# Patient Record
Sex: Female | Born: 1960 | Race: White | Hispanic: No | Marital: Married | Smoking: Never smoker
Health system: Southern US, Community
[De-identification: ages and names within clinical notes are randomized; demographics above are authoritative.]

## PROBLEM LIST (undated history)

## (undated) DIAGNOSIS — K635 Polyp of colon: Secondary | ICD-10-CM

## (undated) DIAGNOSIS — E785 Hyperlipidemia, unspecified: Secondary | ICD-10-CM

## (undated) DIAGNOSIS — E039 Hypothyroidism, unspecified: Secondary | ICD-10-CM

## (undated) DIAGNOSIS — R011 Cardiac murmur, unspecified: Secondary | ICD-10-CM

## (undated) DIAGNOSIS — Z87898 Personal history of other specified conditions: Secondary | ICD-10-CM

## (undated) DIAGNOSIS — Z87440 Personal history of urinary (tract) infections: Secondary | ICD-10-CM

## (undated) DIAGNOSIS — Z872 Personal history of diseases of the skin and subcutaneous tissue: Secondary | ICD-10-CM

## (undated) HISTORY — DX: Personal history of urinary (tract) infections: Z87.440

## (undated) HISTORY — DX: Polyp of colon: K63.5

## (undated) HISTORY — PX: TONSILLECTOMY: SUR1361

## (undated) HISTORY — DX: Personal history of diseases of the skin and subcutaneous tissue: Z87.2

## (undated) HISTORY — DX: Personal history of other specified conditions: Z87.898

## (undated) HISTORY — DX: Hyperlipidemia, unspecified: E78.5

---

## 1994-07-18 HISTORY — PX: NASAL SEPTUM SURGERY: SHX37

## 2004-06-17 ENCOUNTER — Ambulatory Visit: Payer: Self-pay | Admitting: Internal Medicine

## 2004-06-23 ENCOUNTER — Ambulatory Visit: Payer: Self-pay | Admitting: Internal Medicine

## 2004-12-30 ENCOUNTER — Ambulatory Visit: Payer: Self-pay | Admitting: Internal Medicine

## 2005-03-14 ENCOUNTER — Ambulatory Visit: Payer: Self-pay | Admitting: Unknown Physician Specialty

## 2006-01-03 ENCOUNTER — Ambulatory Visit: Payer: Self-pay | Admitting: Obstetrics and Gynecology

## 2007-01-18 ENCOUNTER — Ambulatory Visit: Payer: Self-pay | Admitting: Obstetrics and Gynecology

## 2007-03-22 ENCOUNTER — Ambulatory Visit: Payer: Self-pay | Admitting: Gastroenterology

## 2008-01-22 ENCOUNTER — Ambulatory Visit: Payer: Self-pay | Admitting: Obstetrics and Gynecology

## 2011-05-05 ENCOUNTER — Ambulatory Visit: Payer: Self-pay | Admitting: Obstetrics and Gynecology

## 2012-06-12 ENCOUNTER — Ambulatory Visit: Payer: Self-pay | Admitting: Obstetrics and Gynecology

## 2013-06-27 ENCOUNTER — Ambulatory Visit: Payer: Self-pay | Admitting: Obstetrics and Gynecology

## 2014-10-24 ENCOUNTER — Ambulatory Visit
Admit: 2014-10-24 | Disposition: A | Discharge status: Home / Self Care | Payer: Self-pay | Attending: Obstetrics and Gynecology | Admitting: Obstetrics and Gynecology

## 2015-09-21 ENCOUNTER — Other Ambulatory Visit: Payer: Self-pay | Admitting: Obstetrics and Gynecology

## 2015-09-21 DIAGNOSIS — Z1231 Encounter for screening mammogram for malignant neoplasm of breast: Secondary | ICD-10-CM

## 2016-10-03 ENCOUNTER — Other Ambulatory Visit: Payer: Self-pay | Admitting: Obstetrics and Gynecology

## 2016-10-03 DIAGNOSIS — Z1231 Encounter for screening mammogram for malignant neoplasm of breast: Secondary | ICD-10-CM

## 2016-10-04 ENCOUNTER — Telehealth: Payer: Self-pay

## 2016-10-04 ENCOUNTER — Other Ambulatory Visit: Payer: Self-pay

## 2016-10-04 DIAGNOSIS — Z86010 Personal history of colonic polyps: Secondary | ICD-10-CM

## 2016-10-04 NOTE — Telephone Encounter (Signed)
Gastroenterology Pre-Procedure Review  Request Date: 12/23/16 Requesting Physician: Dr. Vicente Males  PATIENT REVIEW QUESTIONS: The patient responded to the following health history questions as indicated:    1. Are you having any GI issues? no 2. Do you have a personal history of Polyps? no 3. Do you have a family history of Colon Cancer or Polyps? no 4. Diabetes Mellitus? no 5. Joint replacements in the past 12 months?no 6. Major health problems in the past 3 months?no 7. Any artificial heart valves, MVP, or defibrillator?no    MEDICATIONS & ALLERGIES:    Patient reports the following regarding taking any anticoagulation/antiplatelet therapy:   Plavix, Coumadin, Eliquis, Xarelto, Lovenox, Pradaxa, Brilinta, or Effient? no Aspirin? no  Patient confirms/reports the following medications:  No current outpatient prescriptions on file.   No current facility-administered medications for this visit.     Patient confirms/reports the following allergies:  Allergies not on file  No orders of the defined types were placed in this encounter.   AUTHORIZATION INFORMATION Primary Insurance: 1D#: Group #:  Secondary Insurance: 1D#: Group #:  SCHEDULE INFORMATION: Date: 12/23/16 Time: Location: [place]

## 2016-12-23 ENCOUNTER — Encounter
Admission: RE | Disposition: A | Discharge status: Home / Self Care | Payer: Self-pay | Source: Ambulatory Visit | Attending: Gastroenterology

## 2016-12-23 ENCOUNTER — Encounter: Payer: Self-pay | Admitting: *Deleted

## 2016-12-23 ENCOUNTER — Ambulatory Visit: Payer: 59 | Admitting: Anesthesiology

## 2016-12-23 ENCOUNTER — Ambulatory Visit
Admission: RE | Admit: 2016-12-23 | Discharge: 2016-12-23 | Disposition: A | Discharge status: Home / Self Care | Payer: 59 | Source: Ambulatory Visit | Attending: Gastroenterology | Admitting: Gastroenterology

## 2016-12-23 DIAGNOSIS — K640 First degree hemorrhoids: Secondary | ICD-10-CM | POA: Diagnosis not present

## 2016-12-23 DIAGNOSIS — Z86010 Personal history of colonic polyps: Secondary | ICD-10-CM

## 2016-12-23 DIAGNOSIS — E039 Hypothyroidism, unspecified: Secondary | ICD-10-CM | POA: Insufficient documentation

## 2016-12-23 DIAGNOSIS — D122 Benign neoplasm of ascending colon: Secondary | ICD-10-CM | POA: Diagnosis not present

## 2016-12-23 DIAGNOSIS — R011 Cardiac murmur, unspecified: Secondary | ICD-10-CM | POA: Insufficient documentation

## 2016-12-23 DIAGNOSIS — K5730 Diverticulosis of large intestine without perforation or abscess without bleeding: Secondary | ICD-10-CM | POA: Diagnosis not present

## 2016-12-23 DIAGNOSIS — Z1211 Encounter for screening for malignant neoplasm of colon: Secondary | ICD-10-CM | POA: Diagnosis not present

## 2016-12-23 DIAGNOSIS — D125 Benign neoplasm of sigmoid colon: Secondary | ICD-10-CM | POA: Diagnosis not present

## 2016-12-23 HISTORY — DX: Cardiac murmur, unspecified: R01.1

## 2016-12-23 HISTORY — PX: COLONOSCOPY WITH PROPOFOL: SHX5780

## 2016-12-23 HISTORY — DX: Hypothyroidism, unspecified: E03.9

## 2016-12-23 SURGERY — COLONOSCOPY WITH PROPOFOL
Anesthesia: General

## 2016-12-23 MED ORDER — ONDANSETRON HCL 4 MG/2ML IJ SOLN
INTRAMUSCULAR | Status: DC | PRN
Start: 2016-12-23 — End: 2016-12-23
  Administered 2016-12-23: 4 mg via INTRAVENOUS

## 2016-12-23 MED ORDER — LIDOCAINE HCL (PF) 2 % IJ SOLN
INTRAMUSCULAR | Status: AC
  Filled 2016-12-23: qty 2

## 2016-12-23 MED ORDER — MIDAZOLAM HCL 2 MG/2ML IJ SOLN
INTRAMUSCULAR | Status: DC | PRN
  Administered 2016-12-23: 2 mg via INTRAVENOUS

## 2016-12-23 MED ORDER — PROPOFOL 500 MG/50ML IV EMUL
INTRAVENOUS | Status: AC
  Filled 2016-12-23: qty 50

## 2016-12-23 MED ORDER — PROPOFOL 500 MG/50ML IV EMUL
INTRAVENOUS | Status: DC | PRN
  Administered 2016-12-23: 150 ug/kg/min via INTRAVENOUS

## 2016-12-23 MED ORDER — ONDANSETRON HCL 4 MG/2ML IJ SOLN
INTRAMUSCULAR | Status: AC
Start: 2016-12-23 — End: 2016-12-23
  Filled 2016-12-23: qty 2

## 2016-12-23 MED ORDER — MIDAZOLAM HCL 2 MG/2ML IJ SOLN
INTRAMUSCULAR | Status: AC
  Filled 2016-12-23: qty 2

## 2016-12-23 MED ORDER — SODIUM CHLORIDE 0.9 % IV SOLN
INTRAVENOUS | Status: DC
  Administered 2016-12-23 (×2): via INTRAVENOUS

## 2016-12-23 MED ORDER — LIDOCAINE HCL (CARDIAC) 20 MG/ML IV SOLN
INTRAVENOUS | Status: DC | PRN
  Administered 2016-12-23: 60 mg via INTRAVENOUS

## 2016-12-23 MED ORDER — PROPOFOL 10 MG/ML IV BOLUS
INTRAVENOUS | Status: DC | PRN
  Administered 2016-12-23: 50 mg via INTRAVENOUS

## 2016-12-23 NOTE — Anesthesia Post-op Follow-up Note (Cosign Needed)
Anesthesia QCDR form completed.        

## 2016-12-23 NOTE — H&P (Signed)
  Jonathon Bellows MD [address] [place], [place] [address] Phone: [phone] Fax : [phone]  Primary Care Physician:  Elgie Collard, MD Primary Gastroenterologist:  Dr. Jonathon Bellows   Pre-Procedure History & Physical: HPI:  Linda Leonard is a 56 y.o. female is here for an colonoscopy.   Past Medical History:  Diagnosis Date  . Heart murmur   . Hypothyroidism     Past Surgical History:  Procedure Laterality Date  . NASAL SEPTUM SURGERY  1996  . TONSILLECTOMY     1999    Prior to Admission medications   Medication Sig Start Date End Date Taking? Authorizing Provider  levothyroxine (SYNTHROID, LEVOTHROID) 75 MCG tablet Take 75 mcg by mouth daily before breakfast.   Yes [provider]  Vitamin D, Ergocalciferol, (DRISDOL) 50000 units CAPS capsule Take 50,000 Units by mouth every 30 (thirty) days.   Yes [provider]    Allergies as of 10/05/54  . (Not on File)    History reviewed. No pertinent family history.  Social History   Social History  . Marital status: Married    Spouse name: N/A  . Number of children: N/A  . Years of education: N/A   Occupational History  . Not on file.   Social History Main Topics  . Smoking status: Never Smoker  . Smokeless tobacco: Never Used  . Alcohol use No  . Drug use: No  . Sexual activity: Not on file   Other Topics Concern  . Not on file   Social History Narrative  . No narrative on file    Review of Systems: See HPI, otherwise negative ROS  Physical Exam: BP (!) 127/55   Pulse 97   Temp 97.8 F (36.6 C) (Tympanic)   Resp 14   Ht 5\' 3"  (1.6 m)   Wt 121 lb (54.9 kg)   SpO2 100%   BMI 21.43 kg/m  General:   Alert,  pleasant and cooperative in NAD Head:  Normocephalic and atraumatic. Neck:  Supple; no masses or thyromegaly. Lungs:  Clear throughout to auscultation.    Heart:  Regular rate and rhythm. Abdomen:  Soft, nontender and nondistended. Normal bowel sounds,  without guarding, and without rebound.   Neurologic:  Alert and  oriented x4;  grossly normal neurologically.  Impression/Plan: [name] is here for an colonoscopy to be performed for Screening colonoscopy average risk    Risks, benefits, limitations, and alternatives regarding  colonoscopy have been reviewed with the patient.  Questions have been answered.  All parties agreeable.   Jonathon Bellows, MD  12/24/54, 8:33 AM

## 2016-12-23 NOTE — Op Note (Signed)
[facility] Gastroenterology Patient Name: Linda Leonard Procedure Date: 12/23/2016 8:35 AM MRN: [id number] Account #: [id number] Date of Birth: January 18, 1961 Admit Type: Outpatient Age: 56 Room: [facility] ENDO ROOM 4 Gender: Female Note Status: Finalized Procedure:            Colonoscopy Indications:          Screening for colorectal malignant neoplasm Providers:            Jonathon Bellows MD, MD Referring MD:         Shelby Mattocks. Georga Bora, MD (Referring MD) Medicines:            Monitored Anesthesia Care Complications:        No immediate complications. Procedure:            Pre-Anesthesia Assessment:                       - Prior to the procedure, a History and Physical was                        performed, and patient medications, allergies and                        sensitivities were reviewed. The patient's tolerance of                        previous anesthesia was reviewed.                       - The risks and benefits of the procedure and the                        sedation options and risks were discussed with the                        patient. All questions were answered and informed                        consent was obtained.                       - ASA Grade Assessment: I - A normal, healthy patient.                       After obtaining informed consent, the colonoscope was                        passed under direct vision. Throughout the procedure,                        the patient's blood pressure, pulse, and oxygen                        saturations were monitored continuously. The                        Colonoscope was introduced through the anus and                        advanced to the the cecum, identified by the  appendiceal orifice, IC valve and transillumination.                        The colonoscopy was performed with ease. The patient                        tolerated the procedure well. The quality of the bowel           preparation was excellent. Findings:      The perianal and digital rectal examinations were normal.      A few small-mouthed diverticula were found in the sigmoid colon.      Non-bleeding internal hemorrhoids were found during retroflexion. The       hemorrhoids were medium-sized and Grade I (internal hemorrhoids that do       not prolapse).      Two sessile polyps were found in the sigmoid colon and ascending colon.       The polyps were 3 to 5 mm in size.      The exam was otherwise without abnormality on direct and retroflexion       views. Impression:           - Diverticulosis in the sigmoid colon.                       - Non-bleeding internal hemorrhoids.                       - Two 3 to 5 mm polyps in the sigmoid colon and in the                        ascending colon.                       - The examination was otherwise normal on direct and                        retroflexion views.                       - No specimens collected. Recommendation:       - Discharge patient to home (with escort).                       - Resume previous diet.                       - Continue present medications.                       - Await pathology results.                       - Repeat colonoscopy in 5-10 years for surveillance                        based on pathology results. Procedure Code(s):    --- Professional ---                       W4315, Colorectal cancer screening; colonoscopy on                        individual not meeting criteria  for high risk Diagnosis Code(s):    --- Professional ---                       Z12.11, Encounter for screening for malignant neoplasm                        of colon                       K64.0, First degree hemorrhoids                       D12.5, Benign neoplasm of sigmoid colon                       D12.2, Benign neoplasm of ascending colon                       K57.30, Diverticulosis of large intestine without                         perforation or abscess without bleeding CPT copyright 2016 American Medical Association. All rights reserved. The codes documented in this report are preliminary and upon coder review may  be revised to meet current compliance requirements. Jonathon Bellows, MD Jonathon Bellows MD, MD 12/23/2016 9:10:34 AM This report has been signed electronically. Number of Addenda: 0 Note Initiated On: 12/23/2016 8:35 AM Scope Withdrawal Time: 0 hours 18 minutes 4 seconds  Total Procedure Duration: 0 hours 24 minutes 20 seconds       [facility]

## 2016-12-23 NOTE — Anesthesia Preprocedure Evaluation (Signed)
Anesthesia Evaluation  Patient identified by MRN, date of birth, ID band Patient awake    Reviewed: Allergy & Precautions, NPO status , Patient's Chart, lab work & pertinent test results  Airway Mallampati: II       Dental  (+) Teeth Intact   Pulmonary neg pulmonary ROS,    Pulmonary exam normal        Cardiovascular Exercise Tolerance: Good  Rhythm:Regular     Neuro/Psych negative neurological ROS     GI/Hepatic negative GI ROS, Neg liver ROS,   Endo/Other  Hypothyroidism   Renal/GU negative Renal ROS     Musculoskeletal   Abdominal Normal abdominal exam  (+)   Peds negative pediatric ROS (+)  Hematology negative hematology ROS (+)   Anesthesia Other Findings   Reproductive/Obstetrics                             Anesthesia Physical Anesthesia Plan  ASA: I  Anesthesia Plan: General   Post-op Pain Management:    Induction: Intravenous  PONV Risk Score and Plan: 1 and Ondansetron and Treatment may vary due to age  Airway Management Planned: Natural Airway  Additional Equipment:   Intra-op Plan:   Post-operative Plan:   Informed Consent: I have reviewed the patients History and Physical, chart, labs and discussed the procedure including the risks, benefits and alternatives for the proposed anesthesia with the patient or authorized representative who has indicated his/her understanding and acceptance.     Plan Discussed with: CRNA  Anesthesia Plan Comments:         Anesthesia Quick Evaluation

## 2016-12-23 NOTE — Anesthesia Postprocedure Evaluation (Signed)
Anesthesia Post Note  Patient: Linda Leonard  Procedure(s) Performed: Procedure(s) (LRB): COLONOSCOPY WITH PROPOFOL (N/A)  Patient location during evaluation: PACU Anesthesia Type: General Level of consciousness: awake Pain management: pain level controlled Vital Signs Assessment: post-procedure vital signs reviewed and stable Respiratory status: nonlabored ventilation Cardiovascular status: stable Anesthetic complications: no     Last Vitals:  Vitals:   12/23/16 0941 12/23/16 0942  BP: 107/66 102/68  Pulse: 62 63  Resp: 14 14  Temp:      Last Pain:  Vitals:   12/23/16 0912  TempSrc: Tympanic                 VAN STAVEREN,[place]

## 2016-12-23 NOTE — Transfer of Care (Signed)
Immediate Anesthesia Transfer of Care Note  Patient: Linda Leonard  Procedure(s) Performed: Procedure(s): COLONOSCOPY WITH PROPOFOL (N/A)  Patient Location: PACU and Endoscopy Unit  Anesthesia Type:General  Level of Consciousness: sedated and responds to stimulation  Airway & Oxygen Therapy: Patient Spontanous Breathing and Patient connected to nasal cannula oxygen  Post-op Assessment: Report given to RN and Post -op Vital signs reviewed and stable  Post vital signs: Reviewed and stable  Last Vitals:  Vitals:   12/23/16 0807 12/23/16 0912  BP: (!) 127/55 90/61  Pulse: 97 68  Resp: 14 17  Temp: 36.6 C (!) 35.7 C    Last Pain:  Vitals:   12/23/16 0912  TempSrc: Tympanic         Complications: No apparent anesthesia complications

## 2016-12-26 ENCOUNTER — Encounter: Payer: Self-pay | Admitting: Gastroenterology

## 2016-12-26 LAB — SURGICAL PATHOLOGY

## 2016-12-28 ENCOUNTER — Encounter: Payer: Self-pay | Admitting: Gastroenterology

## 2016-12-30 NOTE — Progress Notes (Signed)
Addendum to procedure note   Two 3 to 5 mm polyps in the sigmoid colon and in the ascending colon.They were completely excised using a cold biopsy forceps and sent in a jar to pathology  Dr Jonathon Bellows MD,MRCP [facility]) Gastroenterology/Hepatology Pager: [phone]

## 2017-02-23 ENCOUNTER — Ambulatory Visit
Admission: RE | Admit: 2017-02-23 | Discharge: 2017-02-23 | Disposition: A | Discharge status: Home / Self Care | Payer: 59 | Source: Ambulatory Visit | Attending: Obstetrics and Gynecology | Admitting: Obstetrics and Gynecology

## 2017-02-23 DIAGNOSIS — Z1231 Encounter for screening mammogram for malignant neoplasm of breast: Secondary | ICD-10-CM | POA: Insufficient documentation

## 2017-10-03 ENCOUNTER — Other Ambulatory Visit: Payer: Self-pay | Admitting: Obstetrics and Gynecology

## 2017-10-03 DIAGNOSIS — Z1231 Encounter for screening mammogram for malignant neoplasm of breast: Secondary | ICD-10-CM

## 2018-03-29 ENCOUNTER — Ambulatory Visit
Admission: RE | Admit: 2018-03-29 | Discharge: 2018-03-29 | Disposition: A | Discharge status: Home / Self Care | Payer: 59 | Source: Ambulatory Visit | Attending: Obstetrics and Gynecology | Admitting: Obstetrics and Gynecology

## 2018-03-29 DIAGNOSIS — Z1231 Encounter for screening mammogram for malignant neoplasm of breast: Secondary | ICD-10-CM

## 2018-11-07 ENCOUNTER — Other Ambulatory Visit: Payer: Self-pay | Admitting: Obstetrics and Gynecology

## 2018-11-07 DIAGNOSIS — Z1231 Encounter for screening mammogram for malignant neoplasm of breast: Secondary | ICD-10-CM

## 2019-02-05 LAB — LIPID PANEL
Cholesterol: 250 — AB (ref 0–200)
HDL: 109 — AB (ref 35–70)
LDL Cholesterol: 131
Triglycerides: 50 (ref 40–160)

## 2019-02-14 ENCOUNTER — Other Ambulatory Visit: Payer: Self-pay

## 2019-02-14 ENCOUNTER — Emergency Department
Admission: EM | Admit: 2019-02-14 | Discharge: 2019-02-14 | Disposition: A | Discharge status: Home / Self Care | Payer: 59 | Attending: Emergency Medicine | Admitting: Emergency Medicine

## 2019-02-14 ENCOUNTER — Emergency Department: Payer: 59

## 2019-02-14 ENCOUNTER — Encounter: Payer: Self-pay | Admitting: Emergency Medicine

## 2019-02-14 DIAGNOSIS — R002 Palpitations: Secondary | ICD-10-CM | POA: Diagnosis present

## 2019-02-14 DIAGNOSIS — H9313 Tinnitus, bilateral: Secondary | ICD-10-CM | POA: Diagnosis not present

## 2019-02-14 DIAGNOSIS — E0590 Thyrotoxicosis, unspecified without thyrotoxic crisis or storm: Secondary | ICD-10-CM | POA: Diagnosis not present

## 2019-02-14 LAB — CBC
HCT: 38.9 % (ref 36.0–46.0)
Hemoglobin: 12.8 g/dL (ref 12.0–15.0)
MCH: 26.1 pg (ref 26.0–34.0)
MCHC: 32.9 g/dL (ref 30.0–36.0)
MCV: 79.2 fL — ABNORMAL LOW (ref 80.0–100.0)
Platelets: 255 10*3/uL (ref 150–400)
RBC: 4.91 MIL/uL (ref 3.87–5.11)
RDW: 13.2 % (ref 11.5–15.5)
WBC: 4.8 10*3/uL (ref 4.0–10.5)
nRBC: 0 % (ref 0.0–0.2)

## 2019-02-14 LAB — COMPREHENSIVE METABOLIC PANEL
ALT: 15 U/L (ref 0–44)
AST: 18 U/L (ref 15–41)
Albumin: 4.3 g/dL (ref 3.5–5.0)
Alkaline Phosphatase: 50 U/L (ref 38–126)
Anion gap: 8 (ref 5–15)
BUN: 8 mg/dL (ref 6–20)
CO2: 24 mmol/L (ref 22–32)
Calcium: 9.1 mg/dL (ref 8.9–10.3)
Chloride: 107 mmol/L (ref 98–111)
Creatinine, Ser: 0.63 mg/dL (ref 0.44–1.00)
GFR calc Af Amer: >60 mL/min (ref >60)
GFR calc non Af Amer: >60 mL/min (ref >60)
Glucose, Bld: 129 mg/dL — ABNORMAL HIGH (ref 70–99)
Potassium: 3.3 mmol/L — ABNORMAL LOW (ref 3.5–5.1)
Sodium: 139 mmol/L (ref 135–145)
Total Bilirubin: 0.7 mg/dL (ref 0.3–1.2)
Total Protein: 7.1 g/dL (ref 6.5–8.1)

## 2019-02-14 LAB — TSH: TSH: 1.216 u[IU]/mL (ref 0.350–4.500)

## 2019-02-14 LAB — TROPONIN I (HIGH SENSITIVITY): Troponin I (High Sensitivity): 4 ng/L (ref <18)

## 2019-02-14 LAB — T4, FREE: Free T4: 1.28 ng/dL — ABNORMAL HIGH (ref 0.61–1.12)

## 2019-02-14 MED ORDER — LORAZEPAM 1 MG PO TABS
1.0000 mg | ORAL_TABLET | Freq: Once | ORAL | Status: AC
  Administered 2019-02-14: 14:00:00 1 mg via ORAL
  Filled 2019-02-14: qty 1

## 2019-02-14 MED ORDER — LEVOTHYROXINE SODIUM 50 MCG PO TABS: 50.0000 ug | ORAL_TABLET | Freq: Every day | ORAL | 11 refills | Status: DC

## 2019-02-14 MED ORDER — PROPRANOLOL HCL 40 MG PO TABS: 40.0000 mg | ORAL_TABLET | Freq: Two times a day (BID) | ORAL | 0 refills | Status: AC | PRN

## 2019-02-14 NOTE — ED Notes (Signed)
AAOx3.  Skin warm and dry.  NAD 

## 2019-02-14 NOTE — ED Triage Notes (Signed)
Pt reports last Friday started with shaking a lot at night, a decrease in appetite, ringing in her ears and the feeling that her heart is beating irregularity.

## 2019-02-14 NOTE — ED Provider Notes (Signed)
[facility] Emergency Department Provider Note       Time seen: ----------------------------------------- 1:41 PM on 02/14/2019 -----------------------------------------   I have reviewed the triage vital signs and the nursing notes.  HISTORY   Chief Complaint Shaking, Irregular Heart Beat, and Tinnitus    HPI Linda Leonard is a 58 y.o. female with a history of heart murmur and hypothyroidism who presents to the ED for shaking as well as decreased appetite, ringing in her ears and feeling like her heart is beating irregularly.  Symptoms started last Friday.  She does not describe any pain.  Past Medical History:  Diagnosis Date  . Heart murmur   . Hypothyroidism     There are no active problems to display for this patient.   Past Surgical History:  Procedure Laterality Date  . COLONOSCOPY WITH PROPOFOL N/A 12/23/2016   Procedure: COLONOSCOPY WITH PROPOFOL;  Surgeon: Jonathon Bellows, MD;  Location: [facility] ENDOSCOPY;  Service: Endoscopy;  Laterality: N/A;  . NASAL SEPTUM SURGERY  1996  . TONSILLECTOMY     1999    Allergies Patient has no known allergies.  Social History Social History   Tobacco Use  . Smoking status: Never Smoker  . Smokeless tobacco: Never Used  Substance Use Topics  . Alcohol use: No  . Drug use: No   Review of Systems Constitutional: Negative for fever.  Positive for poor appetite Cardiovascular: Negative for chest pain.  Positive for palpitations Respiratory: Negative for shortness of breath. Gastrointestinal: Negative for abdominal pain, vomiting and diarrhea. Musculoskeletal: Negative for back pain. Skin: Negative for rash. Neurological: Negative for headaches, focal weakness or numbness.  All systems negative/normal/unremarkable except as stated in the HPI  ____________________________________________   PHYSICAL EXAM:  VITAL SIGNS: ED Triage Vitals  Enc Vitals Group     BP 02/14/19 1245 122/77     Pulse Rate  02/14/19 1245 82     Resp 02/14/19 1245 18     Temp 02/14/19 1245 99.1 F (37.3 C)     Temp Source 02/14/19 1245 Oral     SpO2 02/14/19 1245 100 %     Weight 02/14/19 1219 123 lb (55.8 kg)     Height 02/14/19 1219 5\' 3"  (1.6 m)     Head Circumference --      Peak Flow --      Pain Score 02/14/19 1219 0     Pain Loc --      Pain Edu? --      Excl. in [place]? --    Constitutional: Alert and oriented. Well appearing and in no distress. Eyes: Conjunctivae are normal. Normal extraocular movements. ENT      Head: Normocephalic and atraumatic.      Nose: No congestion/rhinnorhea.      Mouth/Throat: Mucous membranes are moist.      Neck: No stridor. Cardiovascular: Normal rate, regular rhythm. No murmurs, rubs, or gallops. Respiratory: Normal respiratory effort without tachypnea nor retractions. Breath sounds are clear and equal bilaterally. No wheezes/rales/rhonchi. Gastrointestinal: Soft and nontender. Normal bowel sounds Musculoskeletal: Nontender with normal range of motion in extremities. No lower extremity tenderness nor edema. Neurologic:  Normal speech and language. No gross focal neurologic deficits are appreciated.  Skin:  Skin is warm, dry and intact. No rash noted. Psychiatric: Mood and affect are normal. Speech and behavior are normal.  ____________________________________________  EKG: Interpreted by me.  Sinus rhythm with rate of 88 bpm, likely biatrial enlargement, nonspecific ST segment changes, normal QT  ____________________________________________  ED COURSE:  As part of my medical decision making, I reviewed the following data within the [place] History obtained from family if available, nursing notes, old chart and ekg, as well as notes from prior ED visits. Patient presented for shaking, poor appetite, ringing in her ears and irregular heartbeat, we will assess with labs and imaging as indicated at this time.   Procedures  Linda Leonard was  evaluated in Emergency Department on 02/14/2019 for the symptoms described in the history of present illness. She was evaluated in the context of the global COVID-19 pandemic, which necessitated consideration that the patient might be at risk for infection with the SARS-CoV-2 virus that causes COVID-19. Institutional protocols and algorithms that pertain to the evaluation of patients at risk for COVID-19 are in a state of rapid change based on information released by regulatory bodies including the CDC and federal and state organizations. These policies and algorithms were followed during the patient's care in the ED.  ____________________________________________   LABS (pertinent positives/negatives)  Labs Reviewed  COMPREHENSIVE METABOLIC PANEL - Abnormal; Notable for the following components:      Result Value   Potassium 3.3 (*)    Glucose, Bld 129 (*)    All other components within normal limits  CBC - Abnormal; Notable for the following components:   MCV 79.2 (*)    All other components within normal limits  T4, FREE - Abnormal; Notable for the following components:   Free T4 1.28 (*)    All other components within normal limits  TSH  TROPONIN I (HIGH SENSITIVITY)    RADIOLOGY Chest x-ray does not reveal any acute process  ____________________________________________   DIFFERENTIAL DIAGNOSIS   Hypothyroidism, palpitations, arrhythmia, anxiety  FINAL ASSESSMENT AND PLAN  Hyperthyroidism   Plan: The patient had presented for shaking with decreased appetite and ringing in her ears. Patient's labs did reveal that her free T4 was elevated. Patient's imaging did not reveal any acute process.  I think this is hyperthyroidism in the setting of excess Synthroid.  I will decrease her dose of Synthroid, provide low-dose propranolol as needed for her symptoms.   Laurence Aly, MD    Note: This note was generated in part or whole with voice recognition software. Voice  recognition is usually quite accurate but there are transcription errors that can and very often do occur. I apologize for any typographical errors that were not detected and corrected.     Earleen Newport, MD 02/14/19 1450

## 2019-03-04 ENCOUNTER — Encounter: Payer: Self-pay | Admitting: Family Medicine

## 2019-03-04 ENCOUNTER — Other Ambulatory Visit: Payer: Self-pay

## 2019-03-04 ENCOUNTER — Ambulatory Visit (INDEPENDENT_AMBULATORY_CARE_PROVIDER_SITE_OTHER): Payer: 59 | Admitting: Family Medicine

## 2019-03-04 VITALS — BP 118/72 | HR 97 | Temp 98.9°F | Ht 62.5 in | Wt 125.4 lb

## 2019-03-04 DIAGNOSIS — R002 Palpitations: Secondary | ICD-10-CM | POA: Diagnosis not present

## 2019-03-04 DIAGNOSIS — Z7689 Persons encountering health services in other specified circumstances: Secondary | ICD-10-CM | POA: Diagnosis not present

## 2019-03-04 DIAGNOSIS — Z1231 Encounter for screening mammogram for malignant neoplasm of breast: Secondary | ICD-10-CM | POA: Diagnosis not present

## 2019-03-04 NOTE — Progress Notes (Signed)
Subjective:    Patient ID: Linda Leonard, female    DOB: 05-22-1961, 58 y.o.   MRN: [id number]  HPI Chief Complaint  Patient presents with  . New Patient (Initial Visit)    Pt states that since July 24th she has been having jittery feeling, racing HR, strong tinnitis, no appetite with episodes. Pt seen in ED with most recent episode.      This is a 58 yo female who presents today to establish care and with the above cc.     Last CPE- several years ago Mammo- 03/29/2018 Pap-likely due, unsure of last Colonoscopy- 12/23/2016, 5 year recall Tdap- unknown Flu- some years, had last year Eye- not regular Dental- regular Exercise- regular, tennis, weights, walking.   Jittery feeling- was seen in ED 02/14/2019, EKG (? biatrial enlargement, SR), normal CXR, labs showed increased T4 and her levothyroxine dose was decreased. Symptoms are intermittent. Some days are fine. She wakes up feeling jittery and has multiple bowel movements. Saw endocrinologist last week who had her resume her previous dose of levothyroxine 75 mcg.  Episodes started 02/08/2019 and she has had about 6-7 episodes.  Feels uneasy, waves of trembling, feels jittery and feels like she has to take a deep breath. Sometimes with ringing in her ears, has had tinnitus in the past but is now having more frequently. Can't determine if in both or one ear. Recently watched grand children who had colds but had negative covid tests. She had a negative covid test 02/10/19. She does not recall any URI/ viral symptoms prior to episodes starting.   Some palpitations during episodes. Able to do normal activities during episodes. One mild headache, resolved spontaneously. Has several good days in a row and plays tennis or otherwise does vigorous exercise. Episodes resolve within several hours with no particular intervention. She does not recall having any similar symptoms previously.    Past Medical History:  Diagnosis Date  . Heart murmur   .  Hx of vertigo    last spell 2006  . Hx: UTI (urinary tract infection)   . Hyperlipemia   . Hypothyroidism   . Polyp of colon    Past Surgical History:  Procedure Laterality Date  . COLONOSCOPY WITH PROPOFOL N/A 12/23/2016   Procedure: COLONOSCOPY WITH PROPOFOL;  Surgeon: Jonathon Bellows, MD;  Location: [facility] ENDOSCOPY;  Service: Endoscopy;  Laterality: N/A;  . NASAL SEPTUM SURGERY  1996  . TONSILLECTOMY     1999   Family History  Problem Relation Age of Onset  . Cancer Mother   . Hypertension Mother   . COPD Father   . Depression Father   . Cancer Brother   . Cancer Paternal Grandmother   . Heart attack Paternal Grandfather   . Depression Brother   . Early death Brother   . Hypertension Brother   . Breast cancer Neg Hx    Social History   Tobacco Use  . Smoking status: Never Smoker  . Smokeless tobacco: Never Used  Substance Use Topics  . Alcohol use: No  . Drug use: No      Review of Systems  Constitutional: Negative.   HENT: Positive for tinnitus.   Eyes: Negative for photophobia and visual disturbance.  Respiratory: Negative.   Cardiovascular: Positive for palpitations. Negative for chest pain and leg swelling.  Gastrointestinal: Positive for diarrhea. Negative for abdominal pain and blood in stool.  Endocrine: Negative.   Genitourinary: Negative.   Neurological: Positive for headaches (x 1). Negative for  dizziness, speech difficulty, weakness, light-headedness and numbness.  Hematological: Negative.   Psychiatric/Behavioral: Negative for dysphoric mood and sleep disturbance. The patient is not nervous/anxious.    Per HPI    Objective:   Physical Exam Physical Exam  Constitutional: Oriented to person, place, and time. She appears well-developed and well-nourished.  HENT:  Head: Normocephalic and atraumatic.  Eyes: Conjunctivae are normal.  Neck: Normal range of motion. Neck supple.  Cardiovascular: Normal rate, regular rhythm and normal heart sounds.    Pulmonary/Chest: Effort normal and breath sounds normal.  Musculoskeletal: Normal range of motion.  Neurological: Alert and oriented to person, place, and time.  Skin: Skin is warm and dry.  Psychiatric: Normal mood and affect. Behavior is normal. Judgment and thought content normal.  Vitals reviewed.     BP 118/72 (BP Location: Left Arm, Patient Position: Sitting, Cuff Size: Normal)   Pulse 97   Temp 98.9 F (37.2 C) (Temporal)   Ht 5' 2.5" (1.588 m)   Wt 125 lb 6.4 oz (56.9 kg)   SpO2 97%   BMI 22.57 kg/m  Wt Readings from Last 3 Encounters:  03/04/19 125 lb 6.4 oz (56.9 kg)  02/14/19 123 lb (55.8 kg)  12/23/16 121 lb (54.9 kg)   BP Readings from Last 3 Encounters:  03/04/19 118/72  02/14/19 120/72  12/23/16 102/68   EKG- NSR, rate 64, PR 158, QT 404 Rhythm strip for 30 seconds- rate 64, no abnormal beats. She reported feeling some mild palpitations during the time her tracing was performed.      Assessment & Plan:  1. Encounter to establish care - she will schedule a complete physical for 2-3 months   2. Palpitations - unclear etiology for episodes: labs and EKG reassuring and discussed with patient.  - episodes are decreasing in frequency and intensity and we discussed monitoring for 2 weeks to see if they taper off completely. The patient was agreeable and will continue to keep a log of occurrences. She will be in touch sooner if worsening or new symptoms.  - EKG 12-Lead  3. Encounter for screening mammogram for breast cancer - MM Digital Screening; Future   Clarene Reamer, FNP-BC  [facility]  03/05/2019 6:32 AM

## 2019-03-04 NOTE — Patient Instructions (Addendum)
Good to see you today  Continue to journal your episodes and touch base with me via mychart in 2 weeks to let me know what is going on. Get in touch sooner if increased frequency, intensity or duration of episodes.   Please schedule a complete physical exam for the next 2-3 months which will include your pap test  I have put in an order for you to have your mammogram, please call and schedule Please call and schedule an appointment for screening mammogram. A referral is not needed.  [place] [phone]

## 2019-03-05 ENCOUNTER — Encounter: Payer: Self-pay | Admitting: Family Medicine

## 2019-03-18 ENCOUNTER — Encounter: Payer: Self-pay | Admitting: Family Medicine

## 2019-03-21 ENCOUNTER — Ambulatory Visit (INDEPENDENT_AMBULATORY_CARE_PROVIDER_SITE_OTHER): Payer: 59 | Admitting: Family Medicine

## 2019-03-21 ENCOUNTER — Encounter: Payer: Self-pay | Admitting: Family Medicine

## 2019-03-21 DIAGNOSIS — E039 Hypothyroidism, unspecified: Secondary | ICD-10-CM

## 2019-03-21 MED ORDER — HYDROXYZINE HCL 10 MG PO TABS: 10.0000 mg | ORAL_TABLET | Freq: Three times a day (TID) | ORAL | 0 refills | Status: AC | PRN

## 2019-03-21 NOTE — Progress Notes (Signed)
Interactive audio and video telecommunications were attempted between this provider and patient, however failed, due to patient having technical difficulties OR patient did not have access to video capability.  We continued and completed visit with audio only.   Virtual Visit via Telephone Note  I connected with patient on 03/21/19  at 2:55 PM  by telephone and verified that I am speaking with the correct person using two identifiers.  Location of patient: home.    Location of MD: [facility] Name of referring provider (if blank then none associated): Names per persons and role in encounter:  MD: Earlyne Iba, Patient: name listed above.    I discussed the limitations, risks, security and privacy concerns of performing an evaluation and management service by telephone and the availability of in person appointments. I also discussed with the patient that there may be a patient responsible charge related to this service. The patient expressed understanding and agreed to proceed.  CC: "jittery."    History of Present Illness:  Sx started end of July 2020.  She felt uneasy and would need to take a deep breath.  She had some chills.  That happened on and off for a few days, then went to ER.  She has some loose stools with that episode.   She had f/u with endocrine who advised going back to 80mcg dose.    She was feeling well until last night.  Last night with uneasy feeling.  Felt jittery.  Then had diarrhea. Lives with husband who isn't ill.  BP last night was 178/156 on home check, recheck 151/83.  I suspect the first reading was spurious.  BP today has been 100-130s/70-80s recently today.    No blood in stool.  No vomiting.  Dec in appetite.  No abd pain.  Some mild nausea.  The episodes in July and now may be similar.  No fevers.   H/o hashimoto's thyroiditis.    She had used propranolol w/o much relief, but only used once.   She doesn't have panic hx.      Observations/Objective: No apparent distress Speech normal  Assessment and Plan: Apparently recurrent episodes.  I doubt that she has had separate episodes of gastroenteritis without anyone else in the household becoming sick concurrently.  We talked about this being a possible Hashimoto's flare.  I will update her PCP and endo about her situation. In the meantime, reasonable for supportive care. Encourage PO fluids.  Propranolol prn.  Hydroxyzine prn.   Follow Up Instructions: See above.   I discussed the assessment and treatment plan with the patient. The patient was provided an opportunity to ask questions and all were answered. The patient agreed with the plan and demonstrated an understanding of the instructions.   The patient was advised to call back or seek an in-person evaluation if the symptoms worsen or if the condition fails to improve as anticipated.  I provided 15 minutes of non-face-to-face time during this encounter.  Elsie Stain, MD

## 2019-03-21 NOTE — Telephone Encounter (Signed)
Pt left v/m that she was seen on 03/04/19 and is having jitters and diarrhea. Pt wants to know what to do next. I spoke with pt and it is very uncomfortable with jitteriness. Pt will ck vital signs prior to virtual visit today with Dr Damita Dunnings at 2:30. ED precautions given. FYI to Dr Damita Dunnings.

## 2019-03-25 NOTE — Assessment & Plan Note (Signed)
Apparently recurrent episodes.  I doubt that she has had separate episodes of gastroenteritis without anyone else in the household becoming sick concurrently.  We talked about this being a possible Hashimoto's flare.  I will update her PCP and endo about her situation. In the meantime, reasonable for supportive care. Encourage PO fluids.  Propranolol prn.  Hydroxyzine prn.

## 2019-03-26 ENCOUNTER — Encounter: Payer: Self-pay | Admitting: Family Medicine

## 2019-03-27 ENCOUNTER — Telehealth: Payer: Self-pay | Admitting: Family Medicine

## 2019-03-27 NOTE — Telephone Encounter (Signed)
-----   Message from Josetta Huddle, [place] sent at 03/26/2019  9:25 AM EDT ----- Patient said she is doing pretty good.  She did have an episode a few evenings ago but the medications that you suggested helped. ----- Message ----- From: Tonia Ghent, MD Sent: 03/25/2019   1:33 PM EDT To: Josetta Huddle, CMA  Please check on patient about her symptoms.  Please route a copy of her note to Dr. Malissa Hippo with endocrinology.  Already routed a copy of the note to [place].  Thanks.  Brigitte Pulse

## 2019-03-27 NOTE — Telephone Encounter (Signed)
See below.  Noted.  Thanks.

## 2019-04-19 ENCOUNTER — Ambulatory Visit
Admission: RE | Admit: 2019-04-19 | Discharge: 2019-04-19 | Disposition: A | Discharge status: Home / Self Care | Payer: 59 | Source: Ambulatory Visit | Attending: Family Medicine | Admitting: Family Medicine

## 2019-04-19 DIAGNOSIS — Z1231 Encounter for screening mammogram for malignant neoplasm of breast: Secondary | ICD-10-CM | POA: Diagnosis present

## 2019-05-13 ENCOUNTER — Other Ambulatory Visit (HOSPITAL_COMMUNITY)
Admission: RE | Admit: 2019-05-13 | Discharge: 2019-05-13 | Disposition: A | Discharge status: Home / Self Care | Payer: 59 | Source: Ambulatory Visit | Attending: Family Medicine | Admitting: Family Medicine

## 2019-05-13 ENCOUNTER — Encounter: Payer: Self-pay | Admitting: Family Medicine

## 2019-05-13 ENCOUNTER — Ambulatory Visit (INDEPENDENT_AMBULATORY_CARE_PROVIDER_SITE_OTHER): Payer: 59 | Admitting: Family Medicine

## 2019-05-13 ENCOUNTER — Other Ambulatory Visit: Payer: Self-pay

## 2019-05-13 VITALS — BP 130/60 | HR 62 | Temp 98.0°F | Ht 63.0 in | Wt 124.1 lb

## 2019-05-13 DIAGNOSIS — Z124 Encounter for screening for malignant neoplasm of cervix: Secondary | ICD-10-CM | POA: Diagnosis present

## 2019-05-13 DIAGNOSIS — Z23 Encounter for immunization: Secondary | ICD-10-CM

## 2019-05-13 DIAGNOSIS — Z0000 Encounter for general adult medical examination without abnormal findings: Secondary | ICD-10-CM | POA: Diagnosis not present

## 2019-05-13 NOTE — Progress Notes (Signed)
Subjective:    Patient ID: Linda Leonard, female    DOB: 1961-03-11, 58 y.o.   MRN: [id number]  HPI This is a 58 yo female who presents today for CPE. Has been doing well. One further episode (? Hosat beginning of month. Has been following up with endocrine.   Last CPE- 04/2019 Mammo- 04/19/2019 Pap- today Colonoscopy- 12/23/2016 Tdap- unsure, will have today Flu- 04/22/2019 Eye- not recent  Dental- regular Exercise- regular Sleep- generally sleeps well, occasional difficulty getting to sleep.   Past Medical History:  Diagnosis Date  . Heart murmur   . Hx of vertigo    last spell 2006  . Hx: UTI (urinary tract infection)   . Hyperlipemia   . Hypothyroidism   . Polyp of colon    Past Surgical History:  Procedure Laterality Date  . COLONOSCOPY WITH PROPOFOL N/A 12/23/2016   Procedure: COLONOSCOPY WITH PROPOFOL;  Surgeon: Jonathon Bellows, MD;  Location: [facility] ENDOSCOPY;  Service: Endoscopy;  Laterality: N/A;  . NASAL SEPTUM SURGERY  1996  . TONSILLECTOMY     1999   Family History  Problem Relation Age of Onset  . Cancer Mother   . Hypertension Mother   . COPD Father   . Depression Father   . Cancer Brother   . Cancer Paternal Grandmother   . Heart attack Paternal Grandfather   . Depression Brother   . Early death Brother   . Hypertension Brother   . Breast cancer Neg Hx    Social History   Tobacco Use  . Smoking status: Never Smoker  . Smokeless tobacco: Never Used  Substance Use Topics  . Alcohol use: No  . Drug use: No      Review of Systems  Constitutional: Negative.   HENT: Negative.   Eyes: Negative.   Respiratory: Negative.   Cardiovascular: Negative.   Gastrointestinal: Negative.   Allergic/Immunologic: Negative.   Neurological: Negative.   Hematological: Negative.   Psychiatric/Behavioral: Negative.        Objective:   Physical Exam Physical Exam  Constitutional: She is oriented to person, place, and time. She appears well-developed and  well-nourished. No distress.  HENT:  Head: Normocephalic and atraumatic.  Right Ear: External ear normal.  Left Ear: External ear normal.  Nose: Nose normal.  Mouth/Throat: Oropharynx is clear and moist. No oropharyngeal exudate.  Eyes: Conjunctivae are normal. Pupils are equal, round, and reactive to light.  Neck: Normal range of motion. Neck supple. No thyromegaly present.  Cardiovascular: Normal rate, regular rhythm, normal heart sounds and intact distal pulses.   Pulmonary/Chest: Effort normal and breath sounds normal. Right breast exhibits no inverted nipple, no mass, no nipple discharge, no skin change and no tenderness. Left breast exhibits no inverted nipple, no mass, no nipple discharge, no skin change and no tenderness. Breasts are symmetrical.  Abdominal: Soft. Bowel sounds are normal. She exhibits no distension and no mass. There is no tenderness. There is no rebound and no guarding.  Genitourinary: Vagina normal. Pelvic exam was performed with patient supine. There is no rash, tenderness, lesion or injury on the right labia. There is no rash, tenderness, lesion or injury on the left labia. Cervix exhibits no motion tenderness and no discharge. No vaginal discharge found.  Musculoskeletal: Normal range of motion. She exhibits no edema or tenderness.  Lymphadenopathy:    She has no cervical adenopathy.  Neurological: She is alert and oriented to person, place, and time.  Skin: Skin is warm and dry. She  is not diaphoretic.  Psychiatric: She has a normal mood and affect. Her behavior is normal. Judgment and thought content normal.  Vitals reviewed.     BP 130/60 (BP Location: Left Arm, Patient Position: Sitting, Cuff Size: Normal)   Pulse 62   Temp 98 F (36.7 C) (Temporal)   Ht 5\' 3"  (1.6 m)   Wt 124 lb 1.9 oz (56.3 kg)   SpO2 99%   BMI 21.99 kg/m  Wt Readings from Last 3 Encounters:  05/13/19 124 lb 1.9 oz (56.3 kg)  03/04/19 125 lb 6.4 oz (56.9 kg)  02/14/19 123 lb  (55.8 kg)   Depression screen [facility] 2/9 05/13/2019 03/04/2019  Decreased Interest 0 0  Down, Depressed, Hopeless 0 0  PHQ - 2 Score 0 0        Assessment & Plan:  1. Annual physical exam - Discussed and encouraged healthy lifestyle choices- adequate sleep, regular exercise, stress management and healthy food choices.  - reviewed health maintenance. She will check with her insurance company to see if Shingrix covered in the office.   2. Need for Tdap vaccination - Tdap vaccine greater than or equal to 7yo IM  3. Screening for cervical cancer - Cytology - PAP([place])  - she will continue to follow up with Endocrine for Hashimoto's thyroiditis and vitamin D deficiency Clarene Reamer, FNP-BC  [facility]  05/13/2019 10:07 AM

## 2019-05-13 NOTE — Patient Instructions (Signed)
Great to see you today! I will send you a mychart message about your pap results Be in touch if there is anything I can help you with  Health Maintenance for Postmenopausal Women Menopause is a normal process in which your ability to get pregnant comes to an end. This process happens slowly over many months or years, usually between the ages of 4 and 22. Menopause is complete when you have missed your menstrual periods for 12 months. It is important to talk with your health care provider about some of the most common conditions that affect women after menopause (postmenopausal women). These include heart disease, cancer, and bone loss (osteoporosis). Adopting a healthy lifestyle and getting preventive care can help to promote your health and wellness. The actions you take can also lower your chances of developing some of these common conditions. What should I know about menopause? During menopause, you may get a number of symptoms, such as:  Hot flashes. These can be moderate or severe.  Night sweats.  Decrease in sex drive.  Mood swings.  Headaches.  Tiredness.  Irritability.  Memory problems.  Insomnia. Choosing to treat or not to treat these symptoms is a decision that you make with your health care provider. Do I need hormone replacement therapy?  Hormone replacement therapy is effective in treating symptoms that are caused by menopause, such as hot flashes and night sweats.  Hormone replacement carries certain risks, especially as you become older. If you are thinking about using estrogen or estrogen with progestin, discuss the benefits and risks with your health care provider. What is my risk for heart disease and stroke? The risk of heart disease, heart attack, and stroke increases as you age. One of the causes may be a change in the body's hormones during menopause. This can affect how your body uses dietary fats, triglycerides, and cholesterol. Heart attack and stroke are  medical emergencies. There are many things that you can do to help prevent heart disease and stroke. Watch your blood pressure  High blood pressure causes heart disease and increases the risk of stroke. This is more likely to develop in people who have high blood pressure readings, are of African descent, or are overweight.  Have your blood pressure checked: ? Every 3-5 years if you are 49-46 years of age. ? Every year if you are 9 years old or older. Eat a healthy diet   Eat a diet that includes plenty of vegetables, fruits, low-fat dairy products, and lean protein.  Do not eat a lot of foods that are high in solid fats, added sugars, or sodium. Get regular exercise Get regular exercise. This is one of the most important things you can do for your health. Most adults should:  Try to exercise for at least 150 minutes each week. The exercise should increase your heart rate and make you sweat (moderate-intensity exercise).  Try to do strengthening exercises at least twice each week. Do these in addition to the moderate-intensity exercise.  Spend less time sitting. Even light physical activity can be beneficial. Other tips  Work with your health care provider to achieve or maintain a healthy weight.  Do not use any products that contain nicotine or tobacco, such as cigarettes, e-cigarettes, and chewing tobacco. If you need help quitting, ask your health care provider.  Know your numbers. Ask your health care provider to check your cholesterol and your blood sugar (glucose). Continue to have your blood tested as directed by your health care  provider. Do I need screening for cancer? Depending on your health history and family history, you may need to have cancer screening at different stages of your life. This may include screening for:  Breast cancer.  Cervical cancer.  Lung cancer.  Colorectal cancer. What is my risk for osteoporosis? After menopause, you may be at increased  risk for osteoporosis. Osteoporosis is a condition in which bone destruction happens more quickly than new bone creation. To help prevent osteoporosis or the bone fractures that can happen because of osteoporosis, you may take the following actions:  If you are 35-38 years old, get at least 1,000 mg of calcium and at least 600 mg of vitamin D per day.  If you are older than age 69 but younger than age 38, get at least 1,200 mg of calcium and at least 600 mg of vitamin D per day.  If you are older than age 37, get at least 1,200 mg of calcium and at least 800 mg of vitamin D per day. Smoking and drinking excessive alcohol increase the risk of osteoporosis. Eat foods that are rich in calcium and vitamin D, and do weight-bearing exercises several times each week as directed by your health care provider. How does menopause affect my mental health? Depression may occur at any age, but it is more common as you become older. Common symptoms of depression include:  Low or sad mood.  Changes in sleep patterns.  Changes in appetite or eating patterns.  Feeling an overall lack of motivation or enjoyment of activities that you previously enjoyed.  Frequent crying spells. Talk with your health care provider if you think that you are experiencing depression. General instructions See your health care provider for regular wellness exams and vaccines. This may include:  Scheduling regular health, dental, and eye exams.  Getting and maintaining your vaccines. These include: ? Influenza vaccine. Get this vaccine each year before the flu season begins. ? Pneumonia vaccine. ? Shingles vaccine. ? Tetanus, diphtheria, and pertussis (Tdap) booster vaccine. Your health care provider may also recommend other immunizations. Tell your health care provider if you have ever been abused or do not feel safe at home. Summary  Menopause is a normal process in which your ability to get pregnant comes to an end.   This condition causes hot flashes, night sweats, decreased interest in sex, mood swings, headaches, or lack of sleep.  Treatment for this condition may include hormone replacement therapy.  Take actions to keep yourself healthy, including exercising regularly, eating a healthy diet, watching your weight, and checking your blood pressure and blood sugar levels.  Get screened for cancer and depression. Make sure that you are up to date with all your vaccines. This information is not intended to replace advice given to you by your health care provider. Make sure you discuss any questions you have with your health care provider. Document Released: 08/26/2005 Document Revised: 06/27/2018 Document Reviewed: 06/27/2018 Elsevier Patient Education  2020 [employer].

## 2019-05-14 ENCOUNTER — Ambulatory Visit (INDEPENDENT_AMBULATORY_CARE_PROVIDER_SITE_OTHER): Payer: 59

## 2019-05-14 DIAGNOSIS — Z23 Encounter for immunization: Secondary | ICD-10-CM

## 2019-05-15 LAB — CYTOLOGY - PAP
Comment: NEGATIVE
Diagnosis: NEGATIVE
High risk HPV: NEGATIVE

## 2019-05-24 NOTE — Telephone Encounter (Signed)
Numbers entered.

## 2019-07-16 ENCOUNTER — Other Ambulatory Visit: Payer: Self-pay

## 2019-07-16 ENCOUNTER — Ambulatory Visit (INDEPENDENT_AMBULATORY_CARE_PROVIDER_SITE_OTHER): Payer: 59 | Admitting: *Deleted

## 2019-07-16 DIAGNOSIS — Z23 Encounter for immunization: Secondary | ICD-10-CM | POA: Diagnosis not present

## 2019-09-18 DIAGNOSIS — E559 Vitamin D deficiency, unspecified: Secondary | ICD-10-CM | POA: Insufficient documentation

## 2020-05-03 ENCOUNTER — Other Ambulatory Visit: Payer: Self-pay | Admitting: Family Medicine

## 2020-05-03 DIAGNOSIS — E039 Hypothyroidism, unspecified: Secondary | ICD-10-CM

## 2020-05-03 DIAGNOSIS — R7309 Other abnormal glucose: Secondary | ICD-10-CM

## 2020-05-07 ENCOUNTER — Other Ambulatory Visit: Payer: Self-pay

## 2020-05-07 ENCOUNTER — Other Ambulatory Visit (INDEPENDENT_AMBULATORY_CARE_PROVIDER_SITE_OTHER): Payer: 59

## 2020-05-07 DIAGNOSIS — R7309 Other abnormal glucose: Secondary | ICD-10-CM

## 2020-05-07 DIAGNOSIS — E039 Hypothyroidism, unspecified: Secondary | ICD-10-CM

## 2020-05-07 LAB — LIPID PANEL
Cholesterol: 237 mg/dL — ABNORMAL HIGH (ref 0–200)
HDL: 96.8 mg/dL (ref >39.00)
LDL Cholesterol: 130 mg/dL — ABNORMAL HIGH (ref 0–99)
NonHDL: 140.6
Total CHOL/HDL Ratio: 2
Triglycerides: 53 mg/dL (ref 0.0–149.0)
VLDL: 10.6 mg/dL (ref 0.0–40.0)

## 2020-05-07 LAB — HEMOGLOBIN A1C: Hgb A1c MFr Bld: 6.1 % (ref 4.6–6.5)

## 2020-05-13 ENCOUNTER — Other Ambulatory Visit: Payer: Self-pay

## 2020-05-13 ENCOUNTER — Encounter: Payer: Self-pay | Admitting: Family Medicine

## 2020-05-13 ENCOUNTER — Ambulatory Visit (INDEPENDENT_AMBULATORY_CARE_PROVIDER_SITE_OTHER): Payer: 59 | Admitting: Family Medicine

## 2020-05-13 VITALS — BP 122/76 | HR 77 | Temp 97.6°F | Ht 62.5 in | Wt 126.0 lb

## 2020-05-13 DIAGNOSIS — R7303 Prediabetes: Secondary | ICD-10-CM | POA: Diagnosis not present

## 2020-05-13 DIAGNOSIS — M722 Plantar fascial fibromatosis: Secondary | ICD-10-CM | POA: Diagnosis not present

## 2020-05-13 DIAGNOSIS — Z0000 Encounter for general adult medical examination without abnormal findings: Secondary | ICD-10-CM

## 2020-05-13 NOTE — Patient Instructions (Signed)
Good to see you today  Please work on reducing sugars, simple starches, get back to your exercise  Follow up lab only visit in 6 months  If any worsening of heel pain, let me know if you want a referral to podiatrist  Plantar Fasciitis  Plantar fasciitis is a painful foot condition that affects the heel. It occurs when the band of tissue that connects the toes to the heel bone (plantar fascia) becomes irritated. This can happen as the result of exercising too much or doing other repetitive activities (overuse injury). The pain from plantar fasciitis can range from mild irritation to severe pain that makes it difficult to walk or move. The pain is usually worse in the morning after sleeping, or after sitting or lying down for a while. Pain may also be worse after long periods of walking or standing. What are the causes? This condition may be caused by:  Standing for long periods of time.  Wearing shoes that do not have good arch support.  Doing activities that put stress on joints (high-impact activities), including running, aerobics, and ballet.  Being overweight.  An abnormal way of walking (gait).  Tight muscles in the back of your lower leg (calf).  High arches in your feet.  Starting a new athletic activity. What are the signs or symptoms? The main symptom of this condition is heel pain. Pain may:  Be worse with first steps after a time of rest, especially in the morning after sleeping or after you have been sitting or lying down for a while.  Be worse after long periods of standing still.  Decrease after 30-45 minutes of activity, such as gentle walking. How is this diagnosed? This condition may be diagnosed based on your medical history and your symptoms. Your health care provider may ask questions about your activity level. Your health care provider will do a physical exam to check for:  A tender area on the bottom of your foot.  A high arch in your foot.  Pain  when you move your foot.  Difficulty moving your foot. You may have imaging tests to confirm the diagnosis, such as:  X-rays.  Ultrasound.  MRI. How is this treated? Treatment for plantar fasciitis depends on how severe your condition is. Treatment may include:  Rest, ice, applying pressure (compression), and raising the affected foot (elevation). This may be called RICE therapy. Your health care provider may recommend RICE therapy along with over-the-counter pain medicines to manage your pain.  Exercises to stretch your calves and your plantar fascia.  A splint that holds your foot in a stretched, upward position while you sleep (night splint).  Physical therapy to relieve symptoms and prevent problems in the future.  Injections of steroid medicine (cortisone) to relieve pain and inflammation.  Stimulating your plantar fascia with electrical impulses (extracorporeal shock wave therapy). This is usually the last treatment option before surgery.  Surgery, if other treatments have not worked after 12 months. Follow these instructions at home:  Managing pain, stiffness, and swelling  If directed, put ice on the painful area: ? Put ice in a plastic bag, or use a frozen bottle of water. ? Place a towel between your skin and the bag or bottle. ? Roll the bottom of your foot over the bag or bottle. ? Do this for 20 minutes, 2-3 times a day.  Wear athletic shoes that have air-sole or gel-sole cushions, or try wearing soft shoe inserts that are designed for plantar fasciitis.  Raise (elevate) your foot above the level of your heart while you are sitting or lying down. Activity  Avoid activities that cause pain. Ask your health care provider what activities are safe for you.  Do physical therapy exercises and stretches as told by your health care provider.  Try activities and forms of exercise that are easier on your joints (low-impact). Examples include swimming, water aerobics,  and biking. General instructions  Take over-the-counter and prescription medicines only as told by your health care provider.  Wear a night splint while sleeping, if told by your health care provider. Loosen the splint if your toes tingle, become numb, or turn cold and blue.  Maintain a healthy weight, or work with your health care provider to lose weight as needed.  Keep all follow-up visits as told by your health care provider. This is important. Contact a health care provider if you:  Have symptoms that do not go away after caring for yourself at home.  Have pain that gets worse.  Have pain that affects your ability to move or do your daily activities. Summary  Plantar fasciitis is a painful foot condition that affects the heel. It occurs when the band of tissue that connects the toes to the heel bone (plantar fascia) becomes irritated.  The main symptom of this condition is heel pain that may be worse after exercising too much or standing still for a long time.  Treatment varies, but it usually starts with rest, ice, compression, and elevation (RICE therapy) and over-the-counter medicines to manage pain. This information is not intended to replace advice given to you by your health care provider. Make sure you discuss any questions you have with your health care provider. Document Revised: 06/16/2017 Document Reviewed: 05/01/2017 Elsevier Patient Education  2020 [employer].

## 2020-05-13 NOTE — Progress Notes (Signed)
Subjective:    Patient ID: Linda Leonard, female    DOB: 01-12-1961, 59 y.o.   MRN: [id number]  HPI Chief Complaint  Patient presents with  . Annual Exam    discuss heel pain (left)   This is a 59 yo female who presents today for CPE. Has been doing well, husband retired this summer, has been treated for prostate cancer.    Last CPE- 05/13/2019 Mammo- 04/19/2019 Pap- 04/19/2019 Colonoscopy- 12/23/2016 Tdap- 05/13/2019 Flu- annual Covid 19 vaccine- fully vaccinated Eye- this summer, mentioned blurred vision, started eye drops, ? helps Dental- annual Exercise- not regular recenlty Diet- new diagnosis of prediabetes, water and coffee with small amount sweetener. Three meals a day, occasional snack. Sample meal- high fiber tortilla with [place], cheese, avocado. Increased eating carry out and fast food lately with husband's surgery and recovery.   Prediabetes-labs done prior to visit.  Hemoglobin A1c 6.1.  She has an aunt with diabetes otherwise negative family history.  Blurred vision-has had intermittently for the last several months.  Normal eye exam this summer.  Sometimes in the mornings, unclear trigger.  Might be relieved with over-the-counter lubricating eyedrops.  Seems to improve after using.  Does use a computer for long periods of time.  No change in vision, no headaches.  Left heel pain-worse if she does not wear supportive shoes.  Pain with getting up in the morning and standing.  Comes and goes, will have days when it does not bother her.  Has taken some Tylenol with relief.  Shoe insoles have provided some relief as well.   Review of Systems  Constitutional: Negative.   HENT: Negative.   Eyes:       Blurred vision, see HPI  Respiratory: Negative.   Cardiovascular: Negative.   Gastrointestinal: Negative.   Endocrine: Negative.   Genitourinary: Negative.   Musculoskeletal:       Left heel pain, see HPI  Skin: Negative.   Allergic/Immunologic: Negative.    Neurological: Negative.   Hematological: Negative.   Psychiatric/Behavioral: Negative.        Objective:   Physical Exam Vitals reviewed.  Constitutional:      General: She is not in acute distress.    Appearance: Normal appearance. She is normal weight. She is not ill-appearing, toxic-appearing or diaphoretic.  HENT:     Head: Normocephalic and atraumatic.     Right Ear: External ear normal.     Left Ear: External ear normal.     Nose: Nose normal.     Mouth/Throat:     Mouth: Mucous membranes are moist.     Pharynx: Oropharynx is clear.  Eyes:     Conjunctiva/sclera: Conjunctivae normal.  Cardiovascular:     Rate and Rhythm: Normal rate and regular rhythm.     Heart sounds: Normal heart sounds.  Pulmonary:     Effort: Pulmonary effort is normal.     Breath sounds: Normal breath sounds.  Chest:     Breasts:        Right: Normal.        Left: Normal.  Abdominal:     General: Abdomen is flat. Bowel sounds are normal. There is no distension.     Palpations: Abdomen is soft. There is no mass.     Tenderness: There is no abdominal tenderness. There is no guarding or rebound.     Hernia: No hernia is present.  Musculoskeletal:        General: Tenderness (left heel with deep palpation. )  present.     Cervical back: Normal range of motion and neck supple.     Right lower leg: No edema.     Left lower leg: No edema.     Comments: Slightly decreased range of motion with flexion of left ankle.  Lymphadenopathy:     Upper Body:     Right upper body: No supraclavicular, axillary or pectoral adenopathy.     Left upper body: No supraclavicular, axillary or pectoral adenopathy.  Skin:    General: Skin is warm and dry.  Neurological:     Mental Status: She is alert and oriented to person, place, and time.  Psychiatric:        Mood and Affect: Mood normal.        Behavior: Behavior normal.        Thought Content: Thought content normal.        Judgment: Judgment normal.        BP 122/76   Pulse 77   Temp 97.6 F (36.4 C)   Ht 5' 2.5" (1.588 m)   Wt 126 lb (57.2 kg)   SpO2 96%   BMI 22.68 kg/m  Wt Readings from Last 3 Encounters:  05/13/20 126 lb (57.2 kg)  05/13/19 124 lb 1.9 oz (56.3 kg)  03/04/19 125 lb 6.4 oz (56.9 kg)   Depression screen [facility] 2/9 05/13/2020 05/13/2019 03/04/2019  Decreased Interest 0 0 0  Down, Depressed, Hopeless 0 0 0  PHQ - 2 Score 0 0 0       Assessment & Plan:  1. Annual physical exam - Discussed and encouraged healthy lifestyle choices- adequate sleep, regular exercise, stress management and healthy food choices.    2. Prediabetes -Encouraged her to decrease her sugars and simple starches, increase her exercise, will recheck in 6 months - HgB A1c; Future  3. Plantar fasciitis of left foot - Provided written and verbal information regarding diagnosis and treatment. -Follow-up if worsening or no improvement in symptoms with conservative measures, can refer to podiatry  This visit occurred during the SARS-CoV-2 public health emergency.  Safety protocols were in place, including screening questions prior to the visit, additional usage of staff PPE, and extensive cleaning of exam room while observing appropriate contact time as indicated for disinfecting solutions.      Clarene Reamer, FNP-BC  [facility]  05/13/2020 10:19 AM

## 2020-05-19 ENCOUNTER — Encounter: Payer: Self-pay | Admitting: Family Medicine

## 2020-05-19 ENCOUNTER — Other Ambulatory Visit: Payer: Self-pay | Admitting: Family Medicine

## 2020-05-19 DIAGNOSIS — Z1231 Encounter for screening mammogram for malignant neoplasm of breast: Secondary | ICD-10-CM

## 2020-05-20 ENCOUNTER — Other Ambulatory Visit: Payer: Self-pay

## 2020-05-20 ENCOUNTER — Ambulatory Visit
Admission: RE | Admit: 2020-05-20 | Discharge: 2020-05-20 | Disposition: A | Discharge status: Home / Self Care | Payer: 59 | Source: Ambulatory Visit | Attending: Family Medicine | Admitting: Family Medicine

## 2020-05-20 DIAGNOSIS — Z1231 Encounter for screening mammogram for malignant neoplasm of breast: Secondary | ICD-10-CM | POA: Insufficient documentation

## 2020-05-22 ENCOUNTER — Other Ambulatory Visit: Payer: Self-pay | Admitting: Family Medicine

## 2020-05-22 DIAGNOSIS — N6489 Other specified disorders of breast: Secondary | ICD-10-CM

## 2020-05-22 DIAGNOSIS — R928 Other abnormal and inconclusive findings on diagnostic imaging of breast: Secondary | ICD-10-CM

## 2020-05-29 ENCOUNTER — Other Ambulatory Visit: Payer: Self-pay

## 2020-05-29 ENCOUNTER — Ambulatory Visit
Admission: RE | Admit: 2020-05-29 | Discharge: 2020-05-29 | Disposition: A | Discharge status: Home / Self Care | Payer: 59 | Source: Ambulatory Visit | Attending: Family Medicine | Admitting: Family Medicine

## 2020-05-29 DIAGNOSIS — R928 Other abnormal and inconclusive findings on diagnostic imaging of breast: Secondary | ICD-10-CM | POA: Diagnosis present

## 2020-05-29 DIAGNOSIS — N6489 Other specified disorders of breast: Secondary | ICD-10-CM | POA: Insufficient documentation

## 2020-11-11 ENCOUNTER — Other Ambulatory Visit: Payer: 59

## 2020-11-18 ENCOUNTER — Encounter: Payer: 59 | Admitting: Internal Medicine

## 2021-07-21 ENCOUNTER — Telehealth: Payer: Self-pay

## 2021-07-21 ENCOUNTER — Other Ambulatory Visit: Payer: Self-pay | Admitting: Family Medicine

## 2021-07-21 DIAGNOSIS — Z1231 Encounter for screening mammogram for malignant neoplasm of breast: Secondary | ICD-10-CM

## 2021-07-21 NOTE — Telephone Encounter (Signed)
5 yr recall-Patient is ready to schedule procedure. Clinical staff will follow up with patient.

## 2021-07-22 ENCOUNTER — Other Ambulatory Visit: Payer: Self-pay

## 2021-07-22 NOTE — Progress Notes (Signed)
Patient cant schedule till June put in a recall for patient

## 2021-07-26 ENCOUNTER — Other Ambulatory Visit: Payer: Self-pay

## 2021-07-26 ENCOUNTER — Ambulatory Visit
Admission: RE | Admit: 2021-07-26 | Discharge: 2021-07-26 | Disposition: A | Discharge status: Home / Self Care | Payer: 59 | Source: Ambulatory Visit | Attending: Family Medicine | Admitting: Family Medicine

## 2021-07-26 DIAGNOSIS — Z1231 Encounter for screening mammogram for malignant neoplasm of breast: Secondary | ICD-10-CM | POA: Diagnosis not present

## 2021-08-31 DIAGNOSIS — E063 Autoimmune thyroiditis: Secondary | ICD-10-CM | POA: Diagnosis not present

## 2021-08-31 DIAGNOSIS — E559 Vitamin D deficiency, unspecified: Secondary | ICD-10-CM | POA: Diagnosis not present

## 2022-02-28 DIAGNOSIS — N760 Acute vaginitis: Secondary | ICD-10-CM | POA: Diagnosis not present

## 2022-03-10 ENCOUNTER — Telehealth: Payer: Self-pay

## 2022-03-10 ENCOUNTER — Other Ambulatory Visit: Payer: Self-pay

## 2022-03-10 DIAGNOSIS — Z86010 Personal history of colonic polyps: Secondary | ICD-10-CM

## 2022-03-10 MED ORDER — NA SULFATE-K SULFATE-MG SULF 17.5-3.13-1.6 GM/177ML PO SOLN: 1.0000 | Freq: Once | ORAL | 0 refills | Status: AC

## 2022-03-10 NOTE — Telephone Encounter (Signed)
Gastroenterology Pre-Procedure Review  Request Date: 10/09 Requesting Physician: Dr. Vicente Males  PATIENT REVIEW QUESTIONS: The patient responded to the following health history questions as indicated:    1. Are you having any GI issues? no 2. Do you have a personal history of Polyps? yes (12/23/16 performed by Dr. Vicente Males) 3. Do you have a family history of Colon Cancer or Polyps? no 4. Diabetes Mellitus? no 5. Joint replacements in the past 12 months?no 6. Major health problems in the past 3 months?no 7. Any artificial heart valves, MVP, or defibrillator?no    MEDICATIONS & ALLERGIES:    Patient reports the following regarding taking any anticoagulation/antiplatelet therapy:   Plavix, Coumadin, Eliquis, Xarelto, Lovenox, Pradaxa, Brilinta, or Effient? no Aspirin? no  Patient confirms/reports the following medications:  Current Outpatient Medications  Medication Sig Dispense Refill   hydrOXYzine (ATARAX/VISTARIL) 10 MG tablet Take 1 tablet (10 mg total) by mouth 3 (three) times daily as needed for nausea (or for jittery sensation). (Patient not taking: Reported on 05/13/2020) 30 tablet 0   levothyroxine (SYNTHROID) 75 MCG tablet Take 1 tablet by mouth daily. 1/2 tab on Sundays     propranolol (INDERAL) 40 MG tablet Take 1 tablet (40 mg total) by mouth 2 (two) times daily as needed. (Patient not taking: Reported on 05/13/2020) 20 tablet 0   Vitamin D, Ergocalciferol, (DRISDOL) 50000 units CAPS capsule Take 50,000 Units by mouth every 30 (thirty) days. Taking twice a month     No current facility-administered medications for this visit.    Patient confirms/reports the following allergies:  No Known Allergies  No orders of the defined types were placed in this encounter.   AUTHORIZATION INFORMATION Primary Insurance: 1D#: Group #:  Secondary Insurance: 1D#: Group #:  SCHEDULE INFORMATION: Date: 04/25/22 Time: Location: [facility]

## 2022-04-25 ENCOUNTER — Ambulatory Visit: Payer: 59 | Admitting: Anesthesiology

## 2022-04-25 ENCOUNTER — Encounter: Payer: Self-pay | Admitting: Gastroenterology

## 2022-04-25 ENCOUNTER — Ambulatory Visit
Admission: RE | Admit: 2022-04-25 | Discharge: 2022-04-25 | Disposition: A | Discharge status: Home / Self Care | Payer: 59 | Attending: Gastroenterology | Admitting: Gastroenterology

## 2022-04-25 ENCOUNTER — Other Ambulatory Visit: Payer: Self-pay

## 2022-04-25 ENCOUNTER — Encounter
Admission: RE | Disposition: A | Discharge status: Home / Self Care | Payer: Self-pay | Source: Home / Self Care | Attending: Gastroenterology

## 2022-04-25 DIAGNOSIS — D126 Benign neoplasm of colon, unspecified: Secondary | ICD-10-CM

## 2022-04-25 DIAGNOSIS — Z860100 Personal history of colon polyps, unspecified: Secondary | ICD-10-CM

## 2022-04-25 DIAGNOSIS — R011 Cardiac murmur, unspecified: Secondary | ICD-10-CM | POA: Insufficient documentation

## 2022-04-25 DIAGNOSIS — E785 Hyperlipidemia, unspecified: Secondary | ICD-10-CM | POA: Insufficient documentation

## 2022-04-25 DIAGNOSIS — D125 Benign neoplasm of sigmoid colon: Secondary | ICD-10-CM | POA: Insufficient documentation

## 2022-04-25 DIAGNOSIS — E039 Hypothyroidism, unspecified: Secondary | ICD-10-CM | POA: Diagnosis not present

## 2022-04-25 DIAGNOSIS — Z1211 Encounter for screening for malignant neoplasm of colon: Secondary | ICD-10-CM | POA: Insufficient documentation

## 2022-04-25 DIAGNOSIS — K635 Polyp of colon: Secondary | ICD-10-CM | POA: Diagnosis not present

## 2022-04-25 HISTORY — PX: COLONOSCOPY WITH PROPOFOL: SHX5780

## 2022-04-25 SURGERY — COLONOSCOPY WITH PROPOFOL
Anesthesia: General

## 2022-04-25 MED ORDER — DEXMEDETOMIDINE HCL IN NACL 200 MCG/50ML IV SOLN
INTRAVENOUS | Status: DC | PRN
  Administered 2022-04-25 (×2): 4 ug via INTRAVENOUS

## 2022-04-25 MED ORDER — SODIUM CHLORIDE 0.9 % IV SOLN: INTRAVENOUS | Status: DC

## 2022-04-25 MED ORDER — LIDOCAINE HCL (CARDIAC) PF 100 MG/5ML IV SOSY
PREFILLED_SYRINGE | INTRAVENOUS | Status: DC | PRN
  Administered 2022-04-25: 40 mg via INTRAVENOUS

## 2022-04-25 MED ORDER — PROPOFOL 10 MG/ML IV BOLUS
INTRAVENOUS | Status: DC | PRN
  Administered 2022-04-25 (×2): 10 mg via INTRAVENOUS
  Administered 2022-04-25: 70 mg via INTRAVENOUS
  Administered 2022-04-25: 10 mg via INTRAVENOUS

## 2022-04-25 MED ORDER — PROPOFOL 1000 MG/100ML IV EMUL
INTRAVENOUS | Status: AC
  Filled 2022-04-25: qty 100

## 2022-04-25 MED ORDER — PROPOFOL 500 MG/50ML IV EMUL
INTRAVENOUS | Status: DC | PRN
  Administered 2022-04-25: 150 ug/kg/min via INTRAVENOUS

## 2022-04-25 NOTE — Anesthesia Procedure Notes (Signed)
Date/Time: 04/25/2022 8:29 AM  Performed by: Doreen Salvage, CRNAPre-anesthesia Checklist: Patient identified, Emergency Drugs available, Suction available and Patient being monitored Patient Re-evaluated:Patient Re-evaluated prior to induction Oxygen Delivery Method: Nasal cannula Induction Type: IV induction Dental Injury: Teeth and Oropharynx as per pre-operative assessment  Comments: Nasal cannula with etCO2 monitoring

## 2022-04-25 NOTE — Transfer of Care (Signed)
Immediate Anesthesia Transfer of Care Note  Patient: Linda Leonard  Procedure(s) Performed: Procedure(s): COLONOSCOPY WITH PROPOFOL (N/A)  Patient Location: PACU and Endoscopy Unit  Anesthesia Type:General  Level of Consciousness: sedated  Airway & Oxygen Therapy: Patient Spontanous Breathing and Patient connected to nasal cannula oxygen  Post-op Assessment: Report given to RN and Post -op Vital signs reviewed and stable  Post vital signs: Reviewed and stable  Last Vitals:  Vitals:   04/25/22 0752 04/25/22 0849  BP: (!) 138/90 103/60  Pulse: 73 60  Resp: 16 20  Temp: (!) 35.9 C   SpO2: 633% 35%    Complications: No apparent anesthesia complications

## 2022-04-25 NOTE — Anesthesia Preprocedure Evaluation (Signed)
Anesthesia Evaluation  Patient identified by MRN, date of birth, ID band Patient awake    Reviewed: Allergy & Precautions, NPO status , Patient's Chart, lab work & pertinent test results  Airway Mallampati: II  TM Distance: >3 FB Neck ROM: Full    Dental  (+) Teeth Intact   Pulmonary neg pulmonary ROS,    Pulmonary exam normal breath sounds clear to auscultation       Cardiovascular Exercise Tolerance: Good negative cardio ROS Normal cardiovascular exam Rhythm:Regular     Neuro/Psych negative neurological ROS  negative psych ROS   GI/Hepatic negative GI ROS, Neg liver ROS,   Endo/Other  negative endocrine ROSHypothyroidism   Renal/GU negative Renal ROS  negative genitourinary   Musculoskeletal   Abdominal Normal abdominal exam  (+)   Peds negative pediatric ROS (+)  Hematology negative hematology ROS (+)   Anesthesia Other Findings Past Medical History: No date: H/O hyperpigmentation of skin     Comment:  macular amyloidosis No date: Heart murmur No date: Hx of vertigo     Comment:  last spell 2006 No date: Hx: UTI (urinary tract infection) No date: Hyperlipemia No date: Hypothyroidism No date: Polyp of colon  Past Surgical History: 12/23/2016: COLONOSCOPY WITH PROPOFOL; N/A     Comment:  Procedure: COLONOSCOPY WITH PROPOFOL;  Surgeon: Jonathon Bellows, MD;  Location: [facility] ENDOSCOPY;  Service:               Endoscopy;  Laterality: N/A; 1996: NASAL SEPTUM SURGERY No date: TONSILLECTOMY     Comment:  1999  BMI    Body Mass Index: 20.70 kg/m      Reproductive/Obstetrics negative OB ROS                             Anesthesia Physical Anesthesia Plan  ASA: 2  Anesthesia Plan: General   Post-op Pain Management:    Induction: Intravenous  PONV Risk Score and Plan: Propofol infusion and TIVA  Airway Management Planned: Natural Airway  Additional Equipment:    Intra-op Plan:   Post-operative Plan:   Informed Consent: I have reviewed the patients History and Physical, chart, labs and discussed the procedure including the risks, benefits and alternatives for the proposed anesthesia with the patient or authorized representative who has indicated his/her understanding and acceptance.     Dental Advisory Given  Plan Discussed with: CRNA and Surgeon  Anesthesia Plan Comments:         Anesthesia Quick Evaluation

## 2022-04-25 NOTE — Op Note (Signed)
[facility] Gastroenterology Patient Name: Linda Leonard Procedure Date: 04/25/2022 8:25 AM MRN: [id number] Account #: [id number] Date of Birth: December 29, 1960 Admit Type: Outpatient Age: 61 Room: [facility] ENDO ROOM 1 Gender: Female Note Status: Finalized Instrument Name: Jasper Riling [id number] Procedure:             Colonoscopy Indications:           Surveillance: Personal history of adenomatous polyps                         on last colonoscopy 5 years ago Providers:             Jonathon Bellows MD, MD Referring MD:          Jonathon Bellows MD, MD (Referring MD) Medicines:             Monitored Anesthesia Care Complications:         No immediate complications. Procedure:             Pre-Anesthesia Assessment:                        - Prior to the procedure, a History and Physical was                         performed, and patient medications, allergies and                         sensitivities were reviewed. The patient's tolerance                         of previous anesthesia was reviewed.                        - The risks and benefits of the procedure and the                         sedation options and risks were discussed with the                         patient. All questions were answered and informed                         consent was obtained.                        - ASA Grade Assessment: II - A patient with mild                         systemic disease.                        After obtaining informed consent, the colonoscope was                         passed under direct vision. Throughout the procedure,                         the patient's blood pressure, pulse, and oxygen  saturations were monitored continuously. The                         Colonoscope was introduced through the anus and                         advanced to the the cecum, identified by the                         appendiceal orifice. The colonoscopy was performed                          with ease. The patient tolerated the procedure well.                         The quality of the bowel preparation was excellent. Findings:      The perianal and digital rectal examinations were normal.      A 5 mm polyp was found in the sigmoid colon. The polyp was sessile. The       polyp was removed with a cold snare. Resection and retrieval were       complete.      The exam was otherwise without abnormality on direct and retroflexion       views. Impression:            - One 5 mm polyp in the sigmoid colon, removed with a                         cold snare. Resected and retrieved.                        - The examination was otherwise normal on direct and                         retroflexion views. Recommendation:        - Discharge patient to home.                        - Resume previous diet.                        - Continue present medications.                        - Await pathology results.                        - Repeat colonoscopy in 5 years for surveillance. Procedure Code(s):     --- Professional ---                        [phone], Colonoscopy, flexible; with removal of                         tumor(s), polyp(s), or other lesion(s) by snare                         technique Diagnosis Code(s):     --- Professional ---  Z86.010, Personal history of colonic polyps                        K63.5, Polyp of colon CPT copyright 2019 American Medical Association. All rights reserved. The codes documented in this report are preliminary and upon coder review may  be revised to meet current compliance requirements. Jonathon Bellows, MD Jonathon Bellows MD, MD 04/25/2022 8:47:11 AM This report has been signed electronically. Number of Addenda: 0 Note Initiated On: 04/25/2022 8:25 AM Scope Withdrawal Time: 0 hours 9 minutes 44 seconds  Total Procedure Duration: 0 hours 15 minutes 2 seconds  Estimated Blood Loss:  Estimated blood loss: none.      [facility]

## 2022-04-25 NOTE — H&P (Signed)
Jonathon Bellows, MD [address] [place], [place], [place], [address] [address], [place], [place], [place], [address] Phone: [phone]  Fax: [phone]  Primary Care Physician:  Dionicia Abler, PA-C   Pre-Procedure History & Physical: HPI:  Linda Leonard is a 61 y.o. female is here for an colonoscopy.   Past Medical History:  Diagnosis Date   H/O hyperpigmentation of skin    macular amyloidosis   Heart murmur    Hx of vertigo    last spell 2006   Hx: UTI (urinary tract infection)    Hyperlipemia    Hypothyroidism    Polyp of colon     Past Surgical History:  Procedure Laterality Date   COLONOSCOPY WITH PROPOFOL N/A 12/23/2016   Procedure: COLONOSCOPY WITH PROPOFOL;  Surgeon: Jonathon Bellows, MD;  Location: [facility] ENDOSCOPY;  Service: Endoscopy;  Laterality: N/A;   [place]    Prior to Admission medications   Medication Sig Start Date End Date Taking? Authorizing Provider  levothyroxine (SYNTHROID) 75 MCG tablet Take 1 tablet by mouth daily. 1/2 tab on Sundays 03/01/19  Yes [provider]  Vitamin D, Ergocalciferol, (DRISDOL) 50000 units CAPS capsule Take 50,000 Units by mouth every 30 (thirty) days. Taking twice a month   Yes [provider]  hydrOXYzine (ATARAX/VISTARIL) 10 MG tablet Take 1 tablet (10 mg total) by mouth 3 (three) times daily as needed for nausea (or for jittery sensation). 03/21/19   Tonia Ghent, MD  propranolol (INDERAL) 40 MG tablet Take 1 tablet (40 mg total) by mouth 2 (two) times daily as needed. 02/14/19   Earleen Newport, MD    Allergies as of 03/10/2022   (No Known Allergies)    Family History  Problem Relation Age of Onset   Cancer Mother    Hypertension Mother    COPD Father    Depression Father    Cancer Brother    Cancer Paternal Grandmother    Heart attack Paternal Grandfather    Depression Brother    Early death Brother    Hypertension Brother    Breast  cancer Neg Hx     Social History   Socioeconomic History   Marital status: Married    Spouse name: Not on file   Number of children: Not on file   Years of education: Not on file   Highest education level: Not on file  Occupational History   Occupation: Homemaker  Tobacco Use   Smoking status: Never   Smokeless tobacco: Never  Vaping Use   Vaping Use: Never used  Substance and Sexual Activity   Alcohol use: No   Drug use: No   Sexual activity: Not on file  Other Topics Concern   Not on file  Social History Narrative   Not on file   Social Determinants of Health   Financial Resource Strain: Not on file  Food Insecurity: Not on file  Transportation Needs: Not on file  Physical Activity: Not on file  Stress: Not on file  Social Connections: Not on file  Intimate Partner Violence: Not on file    Review of Systems: See HPI, otherwise negative ROS  Physical Exam: BP (!) 138/90   Pulse 73   Temp (!) 96.6 F (35.9 C) (Temporal)   Resp 16   Ht 5' 2.5" (1.588 m)   Wt 52.2 kg   SpO2 100%   BMI 20.70 kg/m  General:  Alert,  pleasant and cooperative in NAD Head:  Normocephalic and atraumatic. Neck:  Supple; no masses or thyromegaly. Lungs:  Clear throughout to auscultation, normal respiratory effort.    Heart:  +S1, +S2, Regular rate and rhythm, No edema. Abdomen:  Soft, nontender and nondistended. Normal bowel sounds, without guarding, and without rebound.   Neurologic:  Alert and  oriented x4;  grossly normal neurologically.  Impression/Plan: Linda Leonard is here for an colonoscopy to be performed for surveillance due to prior history of colon polyps   Risks, benefits, limitations, and alternatives regarding  colonoscopy have been reviewed with the patient.  Questions have been answered.  All parties agreeable.   Jonathon Bellows, MD  04/25/2022, 8:18 AM

## 2022-04-25 NOTE — Anesthesia Postprocedure Evaluation (Signed)
Anesthesia Post Note  Patient: Linda Leonard  Procedure(s) Performed: COLONOSCOPY WITH PROPOFOL  Anesthesia Type: General Anesthetic complications: no   No notable events documented.   Last Vitals:  Vitals:   04/25/22 0752 04/25/22 0849  BP: (!) 138/90 103/60  Pulse: 73 60  Resp: 16 20  Temp: (!) 35.9 C   SpO2: 100% 99%    Last Pain:  Vitals:   04/25/22 0849  TempSrc:   PainSc: 0-No pain                 VAN STAVEREN,Rosemarie Galvis

## 2022-04-26 ENCOUNTER — Encounter: Payer: Self-pay | Admitting: Gastroenterology

## 2022-04-26 LAB — SURGICAL PATHOLOGY

## 2022-04-27 ENCOUNTER — Encounter: Payer: Self-pay | Admitting: Gastroenterology

## 2022-05-13 DIAGNOSIS — R7303 Prediabetes: Secondary | ICD-10-CM | POA: Diagnosis not present

## 2022-05-13 DIAGNOSIS — Z130 Encounter for screening for diseases of the blood and blood-forming organs and certain disorders involving the immune mechanism: Secondary | ICD-10-CM | POA: Diagnosis not present

## 2022-05-19 ENCOUNTER — Other Ambulatory Visit: Payer: Self-pay

## 2022-05-19 DIAGNOSIS — Z1231 Encounter for screening mammogram for malignant neoplasm of breast: Secondary | ICD-10-CM | POA: Diagnosis not present

## 2022-05-19 DIAGNOSIS — Z1331 Encounter for screening for depression: Secondary | ICD-10-CM | POA: Diagnosis not present

## 2022-05-19 DIAGNOSIS — E559 Vitamin D deficiency, unspecified: Secondary | ICD-10-CM | POA: Diagnosis not present

## 2022-05-19 DIAGNOSIS — R7303 Prediabetes: Secondary | ICD-10-CM | POA: Diagnosis not present

## 2022-05-19 DIAGNOSIS — E063 Autoimmune thyroiditis: Secondary | ICD-10-CM | POA: Diagnosis not present

## 2022-05-19 DIAGNOSIS — M8580 Other specified disorders of bone density and structure, unspecified site: Secondary | ICD-10-CM | POA: Diagnosis not present

## 2022-05-19 DIAGNOSIS — Z0000 Encounter for general adult medical examination without abnormal findings: Secondary | ICD-10-CM | POA: Diagnosis not present

## 2022-06-01 DIAGNOSIS — M8588 Other specified disorders of bone density and structure, other site: Secondary | ICD-10-CM | POA: Diagnosis not present

## 2022-09-01 DIAGNOSIS — E063 Autoimmune thyroiditis: Secondary | ICD-10-CM | POA: Diagnosis not present

## 2022-09-08 DIAGNOSIS — E559 Vitamin D deficiency, unspecified: Secondary | ICD-10-CM | POA: Diagnosis not present

## 2022-09-08 DIAGNOSIS — E063 Autoimmune thyroiditis: Secondary | ICD-10-CM | POA: Diagnosis not present

## 2022-10-28 ENCOUNTER — Ambulatory Visit
Admission: RE | Admit: 2022-10-28 | Discharge: 2022-10-28 | Disposition: A | Discharge status: Home / Self Care | Payer: 59 | Source: Ambulatory Visit | Attending: Family Medicine | Admitting: Family Medicine

## 2022-10-28 DIAGNOSIS — Z1231 Encounter for screening mammogram for malignant neoplasm of breast: Secondary | ICD-10-CM | POA: Diagnosis not present

## 2023-03-13 IMAGING — MG MM DIGITAL SCREENING BILAT W/ TOMO AND CAD
8 series · 9 of 24 positions shown · non-contrast
Comparison: Previous exam(s).

CLINICAL DATA: Screening.

EXAM:
DIGITAL SCREENING BILATERAL MAMMOGRAM WITH TOMOSYNTHESIS AND CAD
TECHNIQUE: Bilateral screening digital craniocaudal and mediolateral oblique
mammograms were obtained. Bilateral screening digital breast
tomosynthesis was performed. The images were evaluated with
computer-aided detection.

[L CC synth-2D]
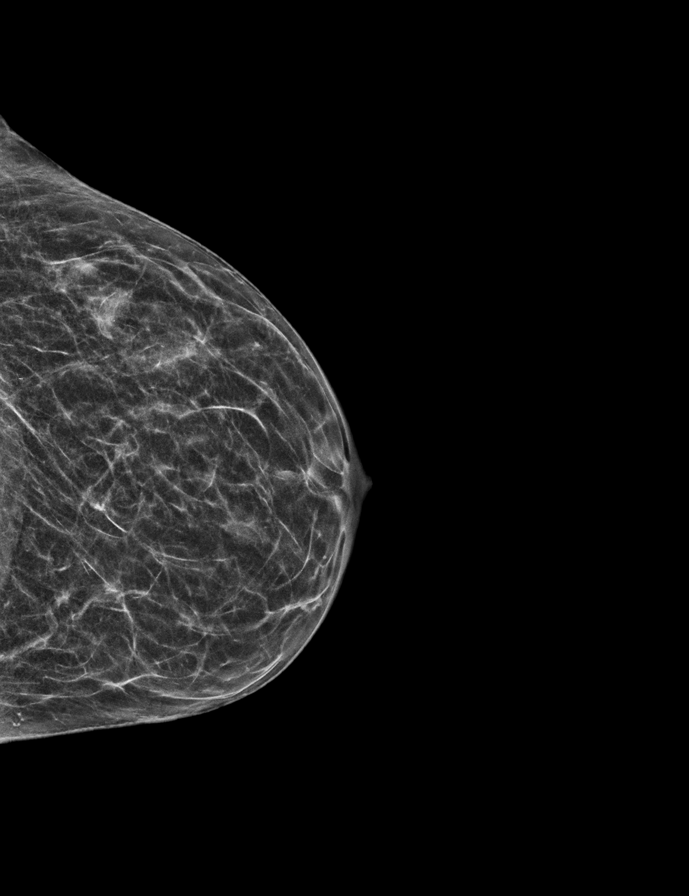

[L MLO synth-2D]
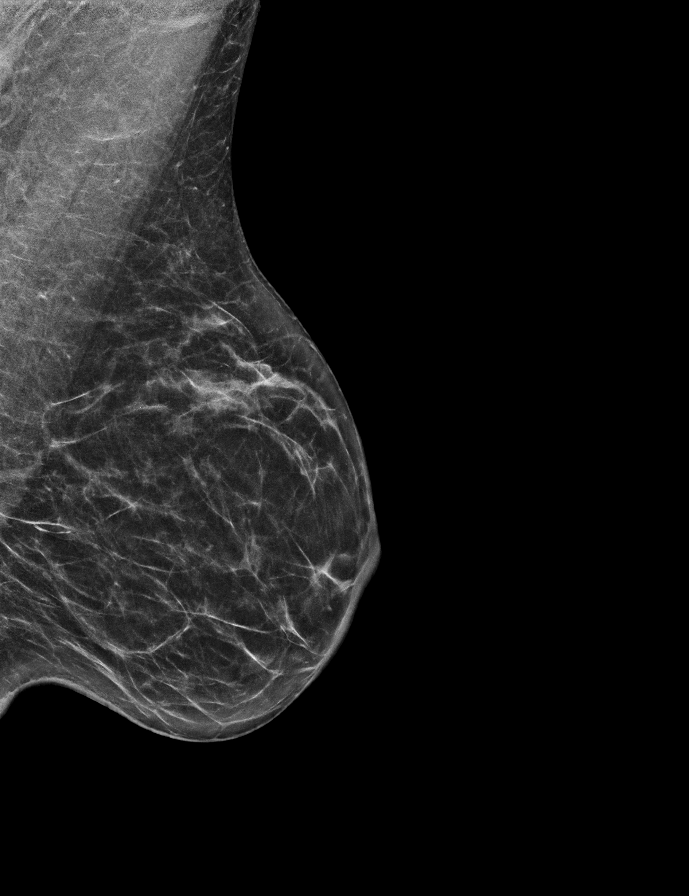

[R CC synth-2D]
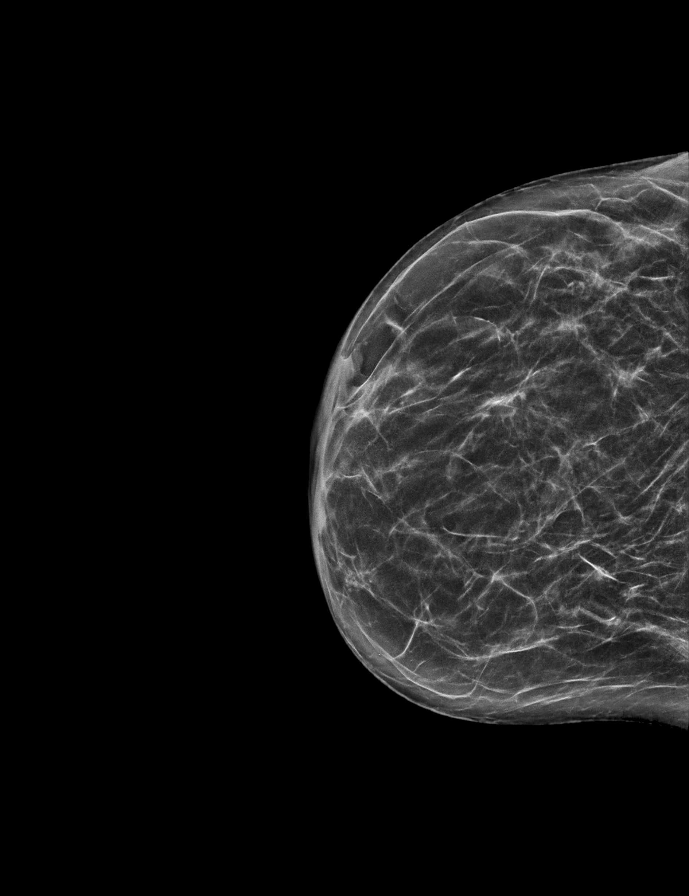

[R MLO synth-2D]
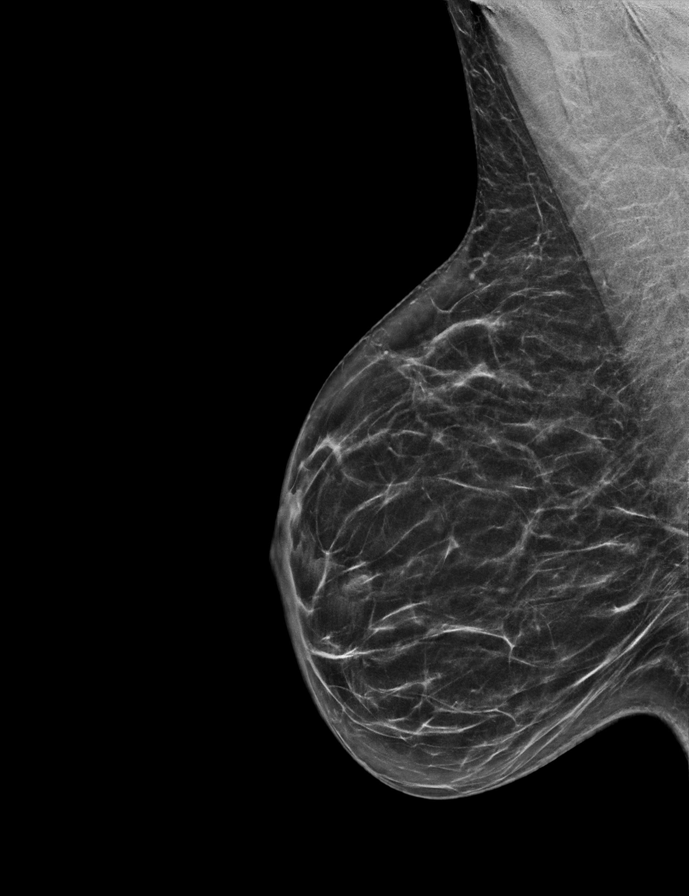

[L MLO tomo · 2 of 51 frames shown]
[frame 17/51]
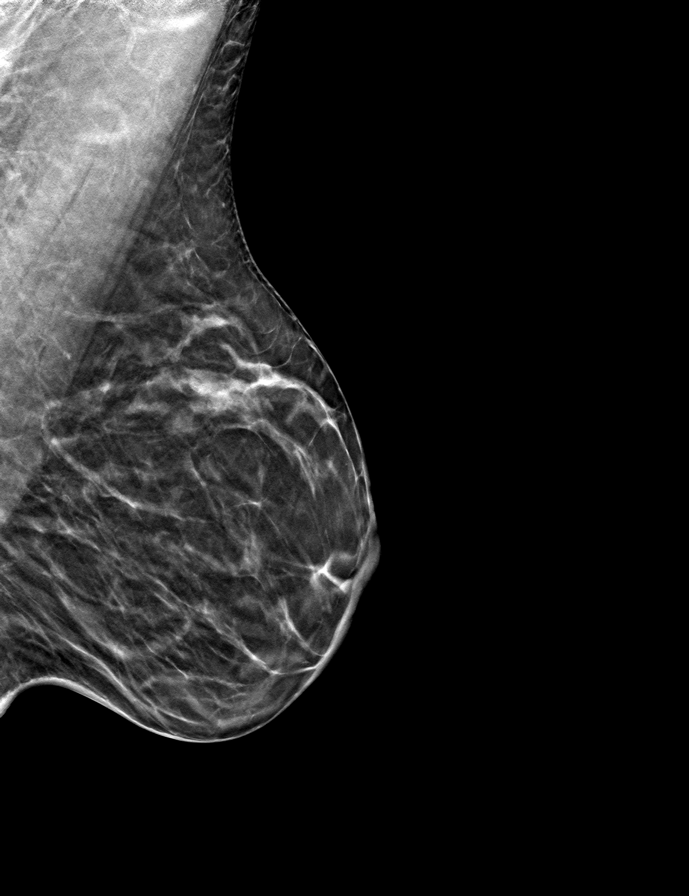
[frame 26/51]
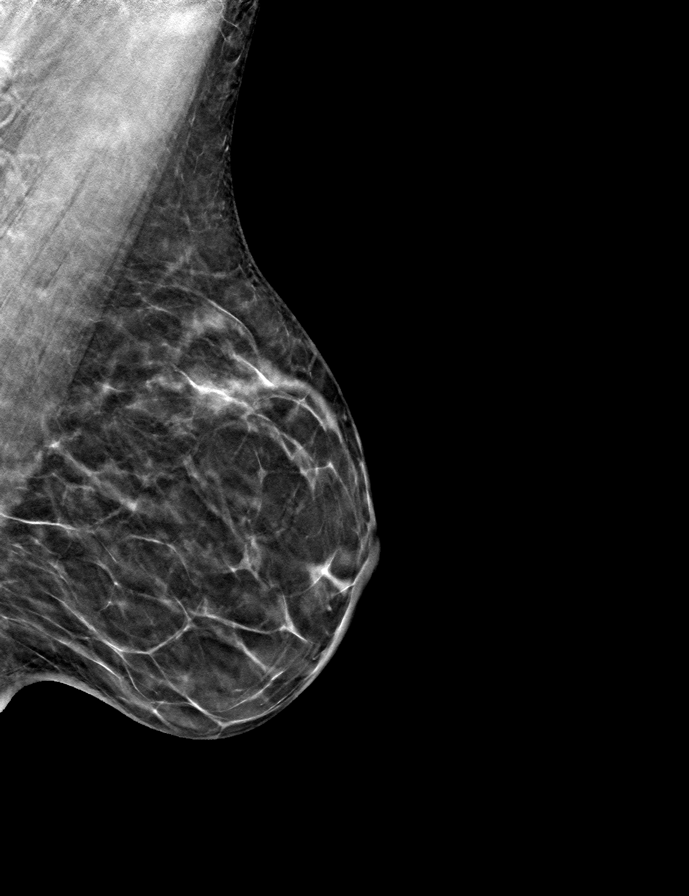

[R CC tomo · tomo slice 29/57.0]
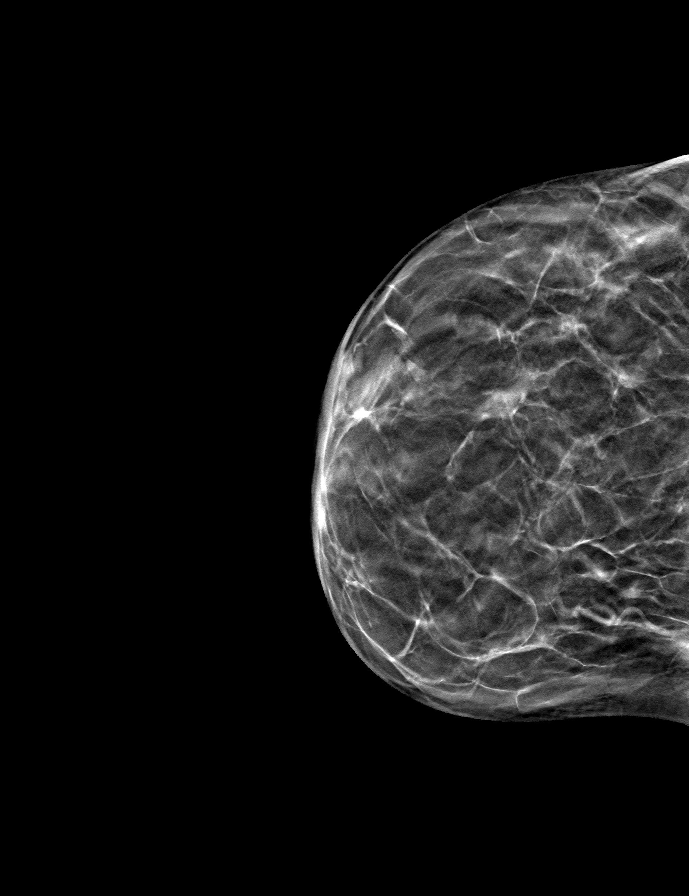

[L CC tomo · tomo slice 21/40.0]
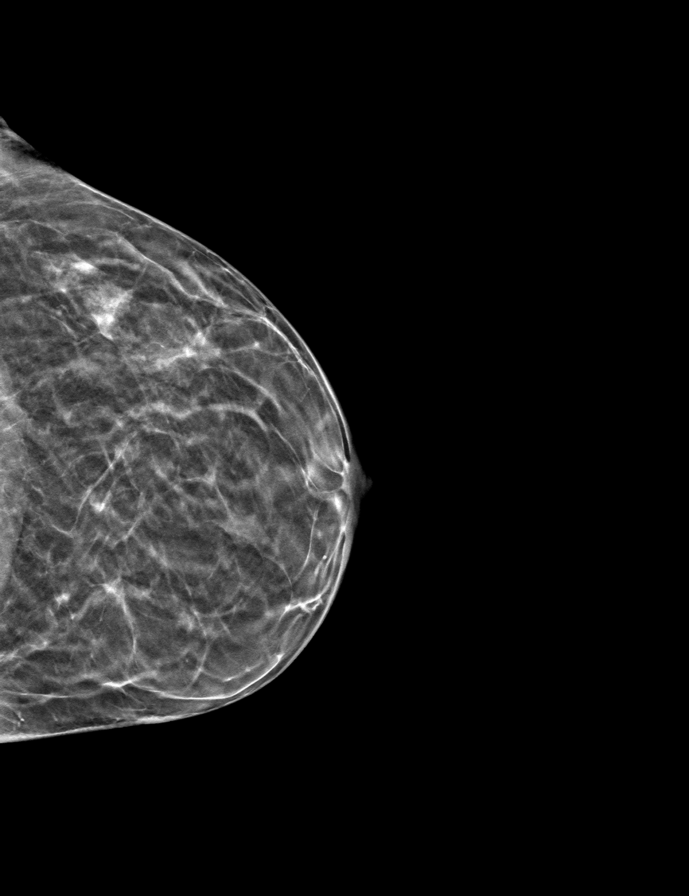

[R MLO tomo · tomo slice 27/54.0]
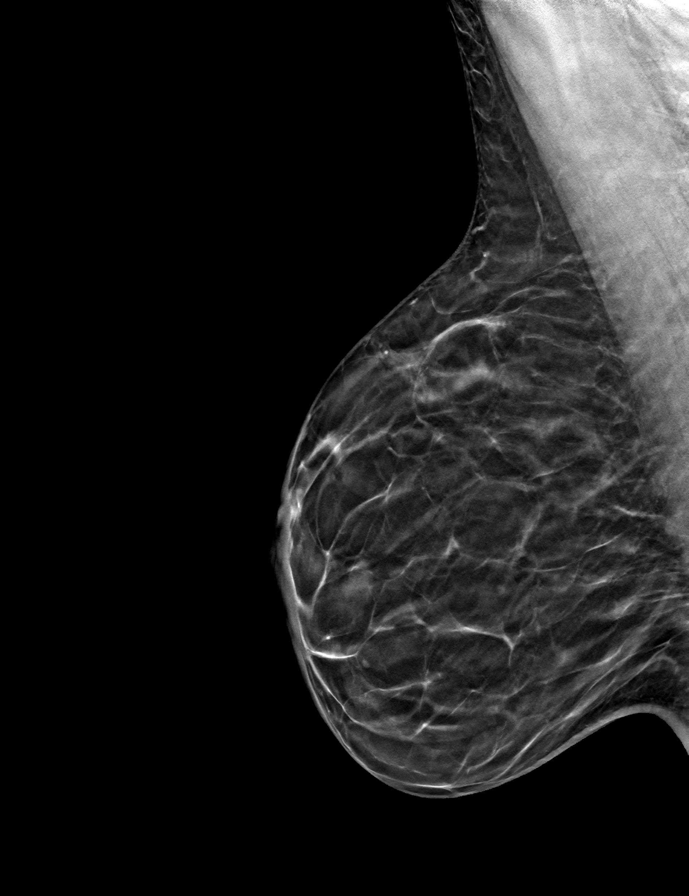

[9 of 24 positions shown; findings below may reference images not displayed]

ACR Breast Density Category b: There are scattered areas of
fibroglandular density.
FINDINGS: There are no findings suspicious for malignancy.
IMPRESSION: No mammographic evidence of malignancy. A result letter of this
screening mammogram will be mailed directly to the patient.

RECOMMENDATION:
Screening mammogram in one year. (Code:51-O-LD2)

BI-RADS CATEGORY  1: Negative.

## 2023-05-15 DIAGNOSIS — R7989 Other specified abnormal findings of blood chemistry: Secondary | ICD-10-CM | POA: Diagnosis not present

## 2023-05-15 DIAGNOSIS — R7303 Prediabetes: Secondary | ICD-10-CM | POA: Diagnosis not present

## 2023-05-15 DIAGNOSIS — E559 Vitamin D deficiency, unspecified: Secondary | ICD-10-CM | POA: Diagnosis not present

## 2023-05-25 ENCOUNTER — Other Ambulatory Visit: Payer: Self-pay | Admitting: Family Medicine

## 2023-05-25 ENCOUNTER — Encounter: Payer: Self-pay | Admitting: Family Medicine

## 2023-05-25 DIAGNOSIS — Z1231 Encounter for screening mammogram for malignant neoplasm of breast: Secondary | ICD-10-CM

## 2023-11-01 ENCOUNTER — Ambulatory Visit
Admission: RE | Admit: 2023-11-01 | Discharge: 2023-11-01 | Disposition: A | Discharge status: Home / Self Care | Source: Ambulatory Visit | Attending: Family Medicine | Admitting: Family Medicine

## 2023-11-01 DIAGNOSIS — Z1231 Encounter for screening mammogram for malignant neoplasm of breast: Secondary | ICD-10-CM
